# Patient Record
Sex: Female | Born: 1980 | Race: Black or African American | Hispanic: No | Marital: Single | Smoking: Current every day smoker
Health system: Southern US, Community
[De-identification: ages and names within clinical notes are randomized; demographics above are authoritative.]

## PROBLEM LIST (undated history)

## (undated) DIAGNOSIS — L309 Dermatitis, unspecified: Secondary | ICD-10-CM

---
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
## 2000-01-04 ENCOUNTER — Inpatient Hospital Stay (HOSPITAL_COMMUNITY): Admission: AD | Admit: 2000-01-04 | Discharge: 2000-01-04 | Payer: Self-pay | Admitting: Obstetrics & Gynecology

## 2000-08-27 ENCOUNTER — Emergency Department (HOSPITAL_COMMUNITY): Admission: EM | Admit: 2000-08-27 | Discharge: 2000-08-27 | Payer: Self-pay | Admitting: Emergency Medicine

## 2001-01-14 ENCOUNTER — Emergency Department (HOSPITAL_COMMUNITY): Admission: EM | Admit: 2001-01-14 | Discharge: 2001-01-14 | Payer: Self-pay | Admitting: *Deleted

## 2001-03-12 ENCOUNTER — Emergency Department (HOSPITAL_COMMUNITY): Admission: EM | Admit: 2001-03-12 | Discharge: 2001-03-13 | Payer: Self-pay | Admitting: Emergency Medicine

## 2001-07-23 ENCOUNTER — Emergency Department (HOSPITAL_COMMUNITY): Admission: EM | Admit: 2001-07-23 | Discharge: 2001-07-24 | Payer: Self-pay

## 2001-11-04 ENCOUNTER — Emergency Department (HOSPITAL_COMMUNITY): Admission: EM | Admit: 2001-11-04 | Discharge: 2001-11-05 | Payer: Self-pay

## 2002-01-12 ENCOUNTER — Inpatient Hospital Stay (HOSPITAL_COMMUNITY): Admission: AD | Admit: 2002-01-12 | Discharge: 2002-01-12 | Payer: Self-pay | Admitting: Obstetrics and Gynecology

## 2002-02-21 ENCOUNTER — Other Ambulatory Visit: Admission: RE | Admit: 2002-02-21 | Discharge: 2002-02-21 | Payer: Self-pay | Admitting: *Deleted

## 2002-03-04 ENCOUNTER — Inpatient Hospital Stay (HOSPITAL_COMMUNITY): Admission: AD | Admit: 2002-03-04 | Discharge: 2002-03-04 | Payer: Self-pay | Admitting: *Deleted

## 2002-04-30 ENCOUNTER — Inpatient Hospital Stay (HOSPITAL_COMMUNITY): Admission: AD | Admit: 2002-04-30 | Discharge: 2002-04-30 | Payer: Self-pay | Admitting: *Deleted

## 2002-05-01 ENCOUNTER — Encounter: Payer: Self-pay | Admitting: *Deleted

## 2002-05-04 ENCOUNTER — Emergency Department (HOSPITAL_COMMUNITY): Admission: EM | Admit: 2002-05-04 | Discharge: 2002-05-04 | Payer: Self-pay | Admitting: Emergency Medicine

## 2002-06-25 ENCOUNTER — Inpatient Hospital Stay (HOSPITAL_COMMUNITY): Admission: AD | Admit: 2002-06-25 | Discharge: 2002-06-27 | Payer: Self-pay | Admitting: Obstetrics and Gynecology

## 2002-06-25 ENCOUNTER — Encounter (INDEPENDENT_AMBULATORY_CARE_PROVIDER_SITE_OTHER): Payer: Self-pay | Admitting: Specialist

## 2002-07-02 ENCOUNTER — Inpatient Hospital Stay (HOSPITAL_COMMUNITY): Admission: AD | Admit: 2002-07-02 | Discharge: 2002-07-02 | Payer: Self-pay | Admitting: Obstetrics and Gynecology

## 2002-11-12 ENCOUNTER — Encounter: Admission: RE | Admit: 2002-11-12 | Discharge: 2002-11-12 | Payer: Self-pay | Admitting: Sports Medicine

## 2003-08-25 ENCOUNTER — Emergency Department (HOSPITAL_COMMUNITY): Admission: EM | Admit: 2003-08-25 | Discharge: 2003-08-25 | Payer: Self-pay | Admitting: Emergency Medicine

## 2004-01-22 ENCOUNTER — Ambulatory Visit: Payer: Self-pay | Admitting: Family Medicine

## 2004-04-26 ENCOUNTER — Emergency Department (HOSPITAL_COMMUNITY): Admission: EM | Admit: 2004-04-26 | Discharge: 2004-04-26 | Payer: Self-pay | Admitting: Emergency Medicine

## 2004-08-10 ENCOUNTER — Other Ambulatory Visit: Admission: RE | Admit: 2004-08-10 | Discharge: 2004-08-10 | Payer: Self-pay | Admitting: Family Medicine

## 2004-08-10 ENCOUNTER — Ambulatory Visit: Payer: Self-pay | Admitting: Sports Medicine

## 2005-01-24 ENCOUNTER — Emergency Department (HOSPITAL_COMMUNITY): Admission: EM | Admit: 2005-01-24 | Discharge: 2005-01-24 | Payer: Self-pay | Admitting: Emergency Medicine

## 2005-05-30 ENCOUNTER — Ambulatory Visit: Payer: Self-pay | Admitting: Family Medicine

## 2005-05-30 ENCOUNTER — Encounter: Payer: Self-pay | Admitting: Emergency Medicine

## 2005-05-30 ENCOUNTER — Observation Stay (HOSPITAL_COMMUNITY): Admission: AD | Admit: 2005-05-30 | Discharge: 2005-05-31 | Payer: Self-pay | Admitting: Gynecology

## 2005-07-28 ENCOUNTER — Ambulatory Visit: Payer: Self-pay | Admitting: Family Medicine

## 2005-08-03 ENCOUNTER — Encounter (INDEPENDENT_AMBULATORY_CARE_PROVIDER_SITE_OTHER): Payer: Self-pay | Admitting: *Deleted

## 2005-08-06 ENCOUNTER — Ambulatory Visit: Payer: Self-pay | Admitting: Family Medicine

## 2005-08-06 ENCOUNTER — Encounter (INDEPENDENT_AMBULATORY_CARE_PROVIDER_SITE_OTHER): Payer: Self-pay | Admitting: *Deleted

## 2005-08-17 ENCOUNTER — Ambulatory Visit: Payer: Self-pay | Admitting: Sports Medicine

## 2005-08-19 ENCOUNTER — Ambulatory Visit: Payer: Self-pay | Admitting: Family Medicine

## 2005-08-27 ENCOUNTER — Ambulatory Visit (HOSPITAL_COMMUNITY): Admission: RE | Admit: 2005-08-27 | Discharge: 2005-08-27 | Payer: Self-pay | Admitting: Family Medicine

## 2005-09-14 ENCOUNTER — Ambulatory Visit: Payer: Self-pay | Admitting: Family Medicine

## 2005-10-15 ENCOUNTER — Ambulatory Visit: Payer: Self-pay | Admitting: Family Medicine

## 2005-11-12 ENCOUNTER — Ambulatory Visit: Payer: Self-pay | Admitting: Family Medicine

## 2005-12-13 ENCOUNTER — Ambulatory Visit: Payer: Self-pay | Admitting: Family Medicine

## 2006-01-10 ENCOUNTER — Encounter (INDEPENDENT_AMBULATORY_CARE_PROVIDER_SITE_OTHER): Payer: Self-pay | Admitting: Specialist

## 2006-01-10 ENCOUNTER — Inpatient Hospital Stay (HOSPITAL_COMMUNITY): Admission: AD | Admit: 2006-01-10 | Discharge: 2006-01-13 | Payer: Self-pay | Admitting: Gynecology

## 2006-01-10 ENCOUNTER — Ambulatory Visit: Payer: Self-pay | Admitting: Gynecology

## 2006-01-18 ENCOUNTER — Ambulatory Visit: Payer: Self-pay | Admitting: Obstetrics and Gynecology

## 2006-06-02 DIAGNOSIS — F339 Major depressive disorder, recurrent, unspecified: Secondary | ICD-10-CM | POA: Insufficient documentation

## 2006-06-02 DIAGNOSIS — L2089 Other atopic dermatitis: Secondary | ICD-10-CM

## 2006-06-02 DIAGNOSIS — G44209 Tension-type headache, unspecified, not intractable: Secondary | ICD-10-CM

## 2006-06-02 DIAGNOSIS — J309 Allergic rhinitis, unspecified: Secondary | ICD-10-CM | POA: Insufficient documentation

## 2006-06-03 ENCOUNTER — Encounter (INDEPENDENT_AMBULATORY_CARE_PROVIDER_SITE_OTHER): Payer: Self-pay | Admitting: *Deleted

## 2006-09-26 ENCOUNTER — Encounter (INDEPENDENT_AMBULATORY_CARE_PROVIDER_SITE_OTHER): Payer: Self-pay | Admitting: Family Medicine

## 2007-02-04 ENCOUNTER — Emergency Department (HOSPITAL_COMMUNITY): Admission: EM | Admit: 2007-02-04 | Discharge: 2007-02-04 | Payer: Self-pay | Admitting: Emergency Medicine

## 2008-02-19 ENCOUNTER — Emergency Department (HOSPITAL_COMMUNITY): Admission: EM | Admit: 2008-02-19 | Discharge: 2008-02-19 | Payer: Self-pay | Admitting: Emergency Medicine

## 2009-10-05 IMAGING — CR DG CHEST 2V
2 series · 2 of 2 positions shown · non-contrast
Comparison: None

CLINICAL DATA: Chest pain

CHEST - 2 VIEW

[w chest pa]
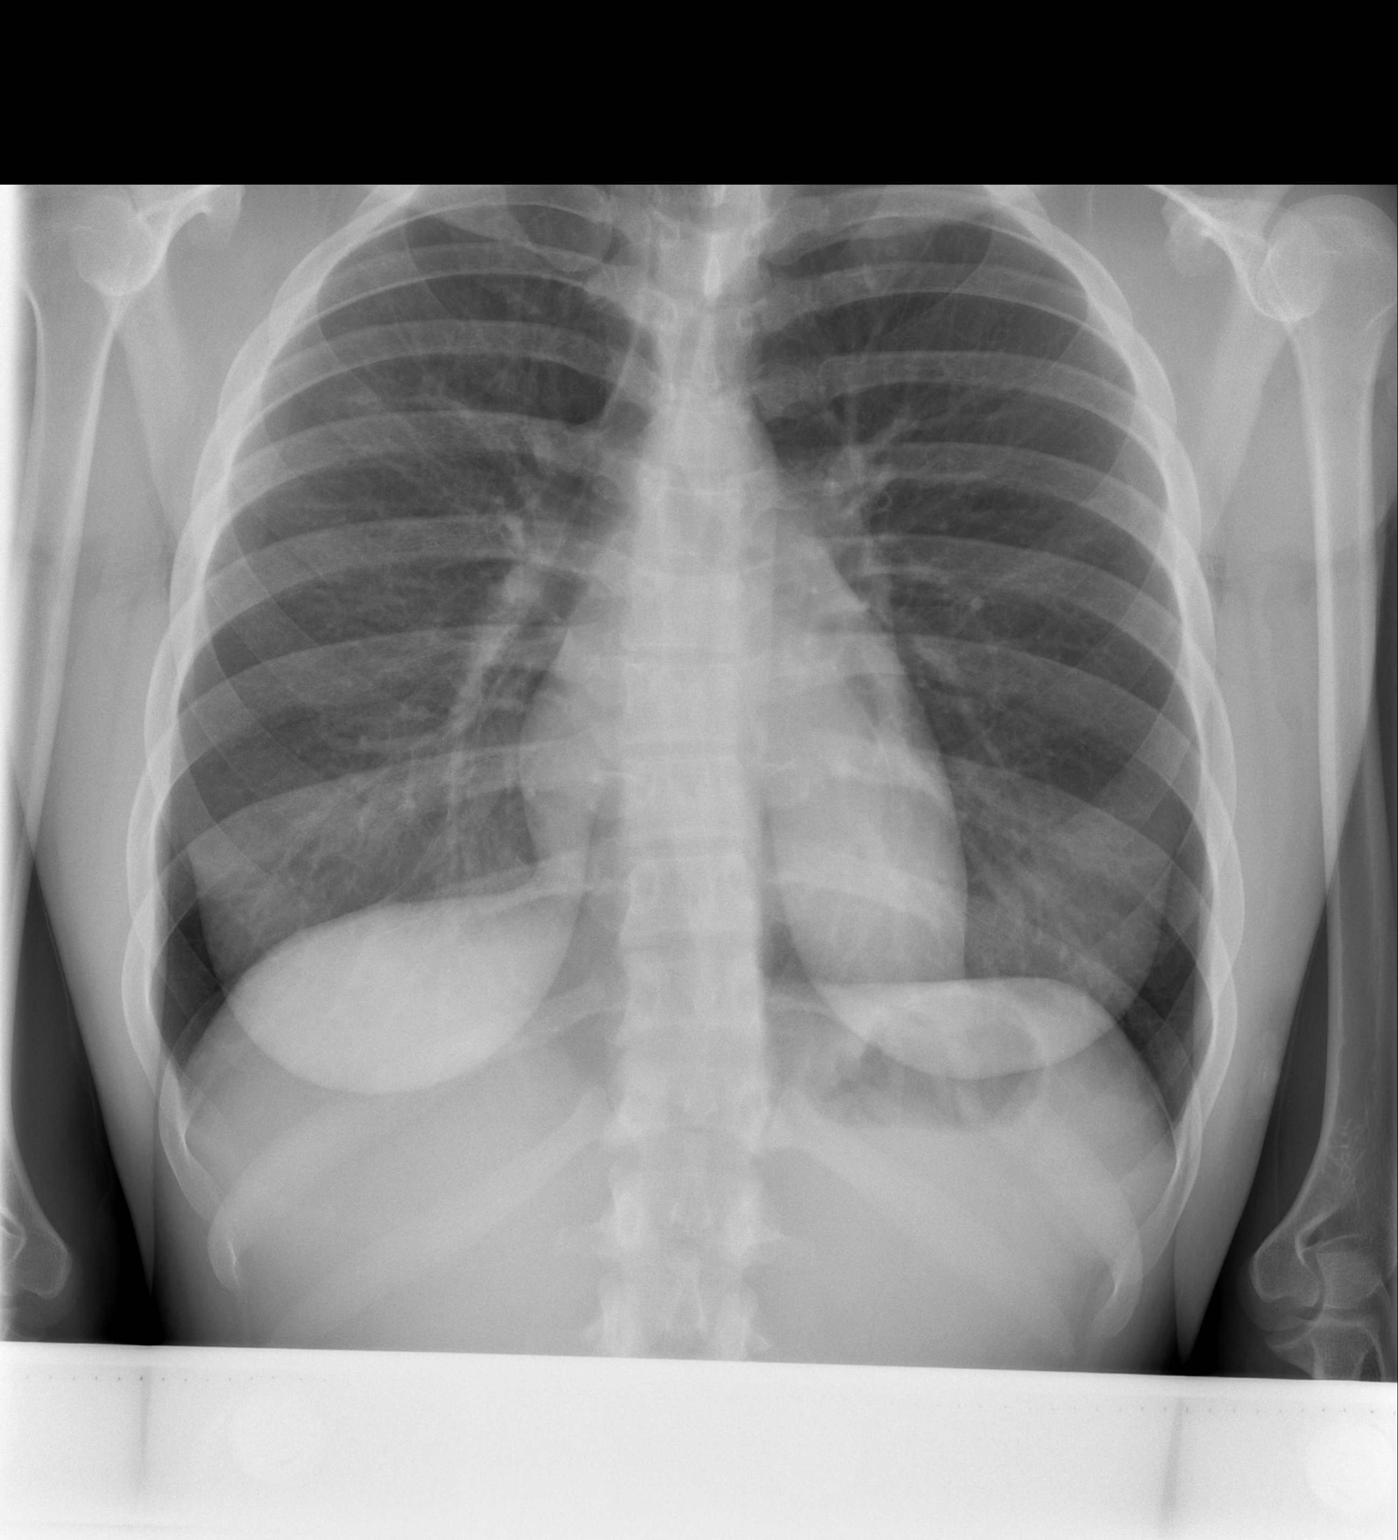

[w chest lat]
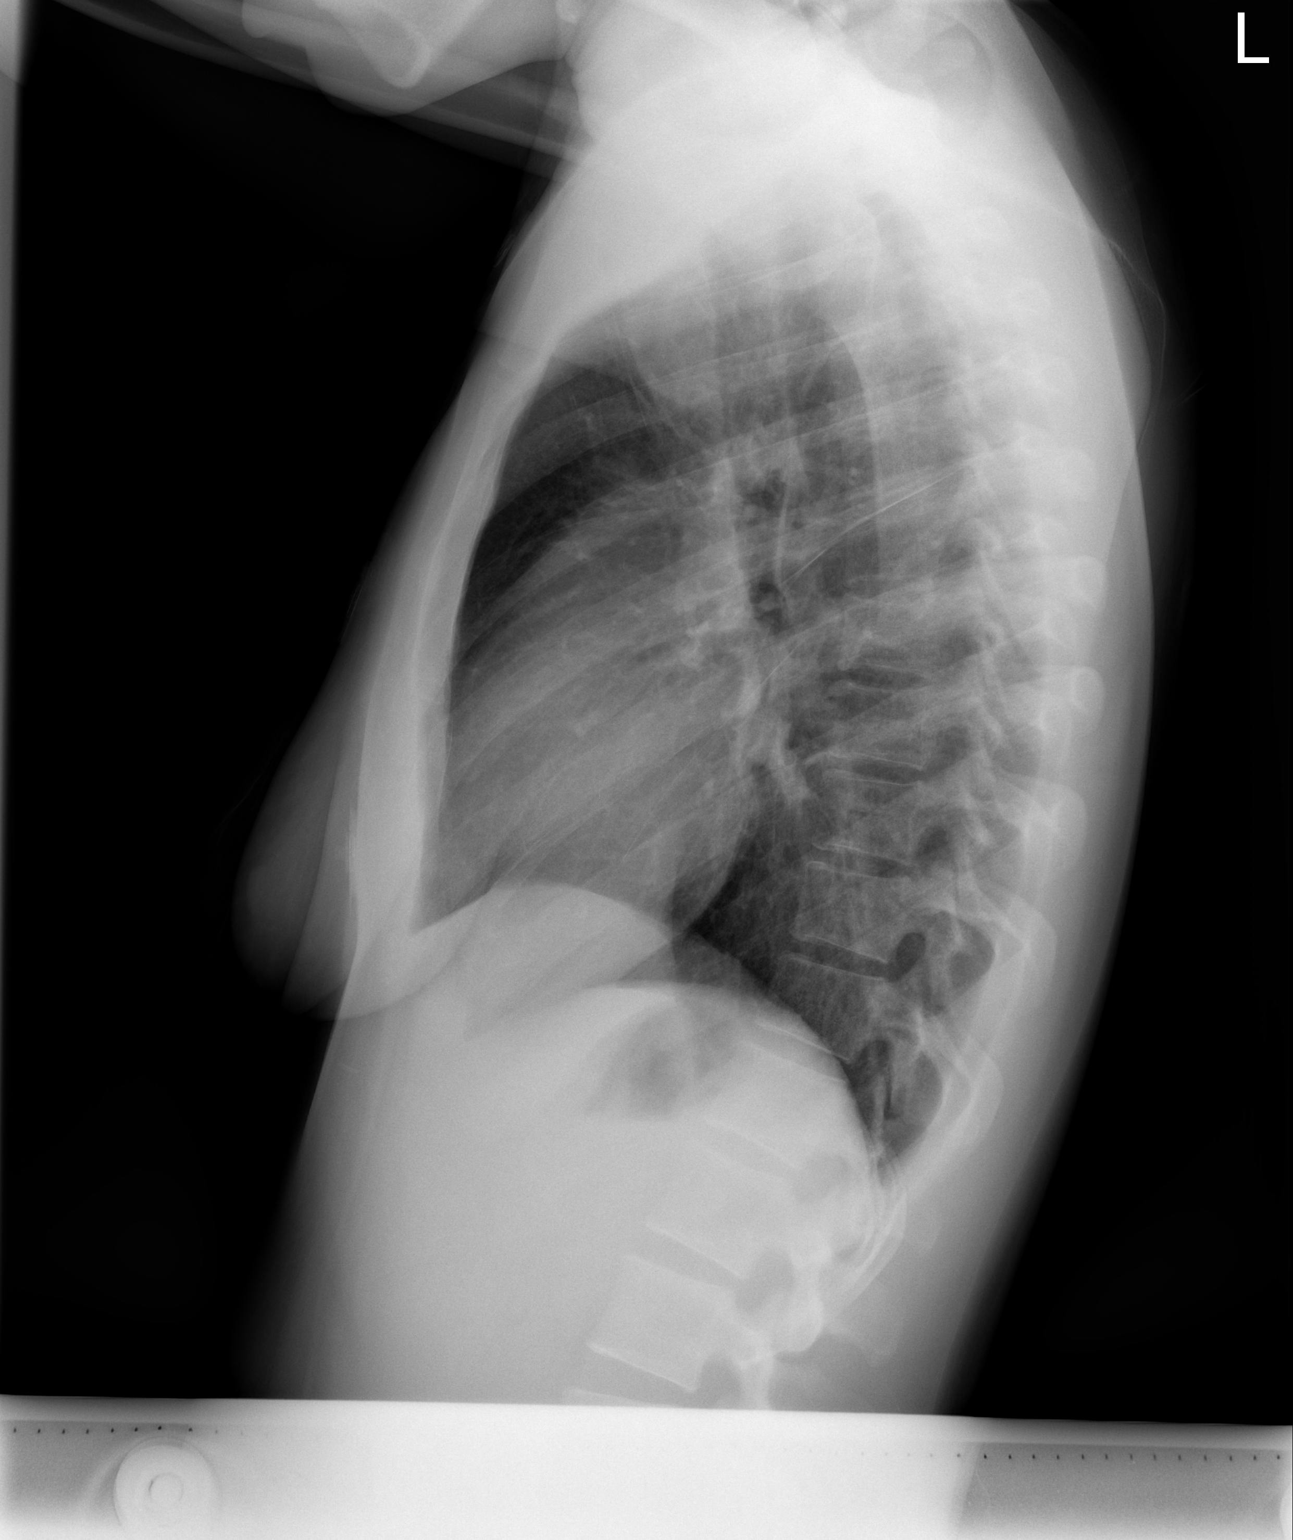

[2 of 2 positions shown; findings below may reference images not displayed]

FINDINGS: Heart size is normal.  Lungs are clear.  No pleural
effusion.
IMPRESSION: No acute cardiopulmonary process.

## 2010-08-21 NOTE — Discharge Summary (Signed)
[name], FREW               ACCOUNT NO.:  [id number]   MEDICAL RECORD NO.:  [id number]          PATIENT TYPE:  INP   LOCATION:  9302                          FACILITY:  [facility]   PHYSICIAN:  Angie B. Merlene Morse, MD  DATE OF BIRTH:  1981/03/21   DATE OF ADMISSION:  05/30/2005  DATE OF DISCHARGE:  05/31/2005                                 DISCHARGE SUMMARY   ADMITTING ATTENDING:  Dr. Farrel Gobble.   DISCHARGING ATTENDING:  Dr. Penne Lash.   ADMITTING HISTORY AND PHYSICAL:  The patient is a 30 year old G5, P3-0-1-3,  who was transferred to Dr. Farrel Gobble from [facility] with a 1-1/2  week history of nausea and vomiting.  She was to be [redacted] weeks pregnant by an  ultrasound.  She states that she had no history of hyperemesis with her  other 3 children.   HOSPITAL COURSE:  The patient was admitted.  She received IV fluid  hydration.  She received Reglan, Zofran and Phenergan.  The Reglan was  scheduled, and Zofran and Phenergan were as needed.  She was tolerating food  at the time of discharge and had not vomited in greater than 24 hours so she  was discharged home.   SIGNIFICANT LABORATORY:  BMP:  Sodium 135, potassium 3.2, chloride 104, CO2  25, glucose 102, BUN less than 1, creatinine 0.7 and calcium 8.5.  She had a  UA that was negative except for ketones greater than 80.  Quantitative beta  hCG of 23,166.  GC and Chlamydia negative.  Wet prep negative.  Amylase and  lipase within normal limits.  AST 14 and ALT 10.  CBC with a white count of  11.6, hemoglobin 13.1, hematocrit 38.2 and platelets 258.  Ultrasound on  February 25 showed an intrauterine pregnancy with fetal heart tones of 182.  Crown rump length 3.9 mm corresponding gestational age of [redacted] weeks 0 days.   DISCHARGE INSTRUCTIONS:  The patient was discharged to home.  She was to  follow up with [facility].   DISCHARGE MEDICINES:  1.  Phenergan 20 mg 1 p.o. q.4-6h. p.r.n. #60 with 1 refill.  2.  Reglan 10 mg 1 p.o.  q.i.d. #120 with 1 refill.  3.  Zofran 8 mg sublingual q.8h. p.r.n. #60 with 1 refill.  4.  Phenergan suppository 25 mg #20 with 1 refill.  5.  Prenatal vitamins.           ______________________________  August Saucer Merlene Morse, MD     ABC/MEDQ  D:  05/31/2005  T:  05/31/2005  Job:  [id number]

## 2010-08-21 NOTE — Discharge Summary (Signed)
[facility]  Patient:    [name], [name]                      MRN: [id number] Adm. Date:  [id number] Disc. Date: [id number] Attending:  Tammi Sou Dictator:   Raynelle Jan, M.D. CC:         Bing Neighbors. Clearance Coots, M.D.             [facility]                           Discharge Summary  DATE OF BIRTH:                1980-11-02.  ADMISSION DIAGNOSES:          1. Intrauterine pregnancy.                               2. Febrile illness.  DISCHARGE DIAGNOSES:          1. Viral syndrome.                               2. Intrauterine pregnancy.  ADMISSION HISTORY:            Briefly, this is an 30 year old gravida 3, para 1-0-1-1 at approximately 5-weeks and 6-days gestation via ultrasound in Maternity Admission, who presents with a two-day history of fever, chills, muscle aches, headache, nausea and vomiting; complained of chest pain, which was sharp in nature, lasting approximately two minutes and low lower abdominal pain.  Her prenatal care is at [facility].  PAST MEDICAL HISTORY:         Significant only for Trichomonas.  MEDICATIONS:                  She is only on prenatal vitamins and Tylenol.  ALLERGIES:                    She has no known drug allergies.  SOCIAL HISTORY:               She is unemployed.  No tobacco, alcohol or drug use.  FAMILY HISTORY:               Negative.  PHYSICAL EXAMINATION:         Her exam on admission included a temperature of 101.8, her pulse was 122, her respirations 20, her blood pressure was 137/59. he was saturating 97% on room air.  She appeared comfortable, in no apparent distress. HEENT was unremarkable.  Her neck was supple.  LUNGS:  She had decreased breath  sounds in the right lower lobe.  Heart was tachycardic but was otherwise unremarkable.  Her abdomen was soft and was tender to palpation in the bilateral lower quadrants and some tenderness in the  suprapubic area.  She had positive bowel sounds.  No rebound or guarding.  Her genital exam was significant for a whitish discharge with some slight bilateral adnexal tenderness.  Her extremities are without clubbing, cyanosis, or edema.  Her skin has increased warmth but no rash.  LABORATORY DATA:              On admission, her CBC showed a white count of 4.5, hemoglobin of 11.4 and hematocrit 32.6 and platelets were 235,000 with  a normal  differential.  Her urine was positive for ketones but was negative for leukocyte esterase or nitrite.  Her wet prep had a few yeasts, moderate wbcs, too numerous to count bacteria.  BMET was within normal limits.  Her serum beta hCG was 12,535.  An ultrasound was performed on the night of admission, showing an intrauterine gestational sac consistent with 5-weeks 6-days gestation and she was admitted to the third floor due to fever, chills, dehydration, nausea and vomiting.  HOSPITAL COURSE:              Over her hospital course, patient gradually improved with hydration, Phenergan and Motrin for her aches and pains.  By discharge, she had remained afebrile x 48 hours.  Her exam was significant only for some mild bilateral lower abdominal tenderness and some mild substernal and epigastric tenderness which were relieved with Motrin and GI cocktail.  By discharge, it was felt that her symptoms were most likely secondary to a viral illness and that her dehydration had resolved.  She was able to eat and drink normally, she was ambulating and her symptoms had improved.  She was discharged on May 15, 1999 with followup at [facility].  DISCHARGE MEDICATIONS:        1. Ibuprofen 600 mg p.o. q.6h. p.r.n. pain.                               2. Prenatal vitamins p.o. q.d.  DIET:                         No restrictions.  ACTIVITY:                     No restrictions.  FOLLOWUP:                     She will follow up over at [facility]  on May 29, 1999, as the appointment had already been set up by the patient. DD:  05/15/99 TD:  05/18/99 Job: [id number]

## 2010-08-21 NOTE — Op Note (Signed)
[name], [name]               ACCOUNT NO.:  [id number]   MEDICAL RECORD NO.:  [id number]          PATIENT TYPE:  INP   LOCATION:  9127                          [facility]  [facility]   PHYSICIAN:  Ginger Carne, MD  DATE OF BIRTH:  Apr 11, 1980   DATE OF PROCEDURE:  01/10/2006  DATE OF DISCHARGE:                                 OPERATIVE REPORT   PREOPERATIVE DIAGNOSIS:  Repeat cesarean section, active labor with  sterilization request.   POSTOPERATIVE DIAGNOSES:  1. Repeat cesarean section, active labor with sterilization request.  2. Term viable delivery of female infant.   PROCEDURE:  Repeat low transverse cesarean section, Pomeroy bilateral tubal  ligation.   SURGEON:  Ginger Carne, M.D.   ASSISTANT:  None.   COMPLICATIONS:  None immediate.   ESTIMATED BLOOD LOSS:  650 mL.   SPECIMEN:  Right and left portions of tubes sent to pathology.   ANESTHESIA:  Spinal.   OPERATIVE FINDINGS:  Term infant female delivered in vertex presentation.  Apgars and weight per delivery room record.  No gross abnormalities.  The  baby cried spontaneously at delivery.  Amniotic fluid clear.  Uterus, tubes  and ovaries showed normal decidual changes of pregnancy.  Both tubes  identified from their isthmus to fimbriated end, separate and apart from  their respective round ligaments.   OPERATIVE PROCEDURE:  The patient prepped and draped in usual fashion and  placed in left lateral supine position.  Betadine solution used for  antiseptic, and the patient was catheterized prior to procedure.   After adequate spinal analgesia, an infraumbilical vertical incision was  made and the abdomen opened.  The lower uterine segment incised transversely  away from the bladder edge.  The baby delivered.  Cord clamped and cut, and  infant given to the pediatric staff after bulb suctioning.  Placenta removed  manually.  Uterus inspected.  Closure of the uterine musculature in one  layer with 3-0 Vicryl  running interlocking suture from either end to  midline.  Bleeding points hemostatically checked.  Blood clots removed.   Standard Pomeroy bilateral tubal ligation performed by ligating the isthmus  ampullary portion of the tubes, incorporating about 3 cm with 2-0 plain  suture twice.  The tubes cut above said knots, tips cauterized.  No active  bleeding noted.  The uterus returned to the abdomen.  Closure of the fascia  in two separate layers from either end to midline with 0 Vicryl running  suture and skin staples for the skin.  Instrument and sponge counts were  correct.   The patient tolerated procedure well and returned to the postanesthesia  recovery room in excellent condition.   ADDENDUM:  Placenta was complete, three-vessel cord, central insertion.      Ginger Carne, MD  Electronically Signed     SHB/MEDQ  D:  01/10/2006  T:  01/11/2006  Job:  [id number]

## 2010-08-21 NOTE — Discharge Summary (Signed)
[name], [name]               ACCOUNT NO.:  [id number]   MEDICAL RECORD NO.:  [id number]          PATIENT TYPE:  INP   LOCATION:  9127                          [facility]  [facility]   PHYSICIAN:  Kristin Carne, MD  DATE OF BIRTH:  [birth year]   DATE OF ADMISSION:  01/10/2006  DATE OF DISCHARGE:  01/13/2006                                 DISCHARGE SUMMARY   ADMISSION DIAGNOSIS:  30 year old G5, P3-0-1-3, at 54 weeks with a history  of prior cesarean section x3 who was admitted in early labor.   HOSPITAL COURSE:  The patient was admitted to have her C-section.  She also  desired a BTL and her paper was signed prior to coming here.  The patient  underwent a cesarean section on January 10, 2006.  Please see operative note  for more details.  The patient had an infraumbilical vertical incision and  then an LTCS.  A viable female infant was delivered with three vessel cord.  The baby's Apgars were 9 and 9.  The patient also had a BTL after the  delivery.  The patient had a normal postpartum course and is being  discharged home on postop day three.  She is hemodynamically stable and is  ready to go home.  The patient was O positive, antibody negative, Rubella  immune.  She did not require MR or RhoGAM.  Her baby was circumcised during  her stay here. She is bottle feeding.   LABORATORY DATA:  Prenatal labs include patient O positive, antibody screen  negative, hemoglobin 11.6, platelets 278, Rubella immune, hepatitis B  surface antigen negative, HIV declined, Gonorrhea and Chlamydia negative,  GBS positive on December 21, 2005, DCT 58.  Labs here at the hospital  revealed CBC on October 8 showed white blood cell count 9.6, hemoglobin  12.8, hematocrit 37.8, platelets 189.  On October 9, repeat CBC showed white  blood cell count 13.5, hemoglobin 10.4, hematocrit 30, platelets 144.  She  was O positive, antibody negative, this was nonreactive.   PROCEDURES:  October 8, BTL as well as repeat  LTCS with a vertical incision.   DISCHARGE STATUS:  Stable and improved.   DISCHARGE DIAGNOSIS:  The patient is a 30 year old G5, P4-0-1-4, postop day  three after a repeat low transverse cesarean section with vertical incision  and bilateral tubal ligation.   DISCHARGE MEDICATIONS:  She was given prescriptions for Percocet, ibuprofen,  Colace.  Her BTL serves as her contraception.   DISCHARGE INSTRUCTIONS:  She is to follow up in six weeks at [facility]  and will call to schedule the appointment.  She will also return to the  clinic downstairs postop day 7 through 10 to get her staples removed.     ______________________________  [name], M.D.      Kristin Carne, MD  Electronically Signed    JH/MEDQ  D:  01/13/2006  T:  01/14/2006  Job:  [id number]

## 2010-08-21 NOTE — Discharge Summary (Signed)
NAME:  [name], [name]                         ACCOUNT NO.:  [id number]   MEDICAL RECORD NO.:  [id number]                   PATIENT TYPE:  INP   LOCATION:  9104                                 [facility]  [facility]   PHYSICIAN:  Phil D. Okey Dupre, M.D.                  DATE OF BIRTH:  12-Sep-1980   DATE OF ADMISSION:  06/25/2002  DATE OF DISCHARGE:  06/27/2002                                 DISCHARGE SUMMARY   DISCHARGE DIAGNOSES:  1. Repeat cesarean section.  2. Delivery of single newborn.   DISCHARGE MEDICATIONS:  1. Motrin 800 mg q.8h. p.o. p.r.n.  2. Birth control patch one patch per week for three weeks, no patch on the     fourth week.  The patient is to return to me on Monday, July 02, 2002     for incision check.   ACTIVITY:  Per booklet.  Sexual activity includes no sexual intercourse for  six weeks.  Strict pelvic rest x6 weeks.   DIET:  Regular.   FOLLOW UP:  The patient is to follow up at [facility] in six weeks for  routine postpartum check.   DISPOSITION:  The patient is discharged to home in stable and improved  condition.   HISTORY OF PRESENT ILLNESS:  This was a 30 year old G4, P2-0-1-2 who  presented for a repeat elective cesarean section.  Dated by last menstrual  period she presented estimated date of delivery July 03, 2002.  The patient  had very unremarkable prenatal course.  Was O+.  Antibody negative.  Nonreactive RPR.  Rubella immune.  Hepatitis B surface antigen negative.  The patient had benign examination upon arrival.  The patient was taken to  the operating room for repeat cesarean section at which point in time she  delivered a baby female with spontaneous respirations, three vessel cord at  11:04 on June 25, 2002.  Baby's Apgars were 8 and 9.  Baby's weight was 6  pounds 12 ounces.  Length was 19-1/4 inches.  Head was 13-1/4 inches.  Estimated blood loss was [phone] mL.  This was a scheduled repeat cesarean  section.  The patient's  postoperative course was complicated by wound  difficulties with the patient's incision had poor approximation and some  blood drain on postoperative day one.  Staples had pulled through to the  incision were located unilaterally.  Steri-Strips were applied to the center  of the incision and pressure dressing was applied at that point in time.  The patient had good pain control with oral medications.  Incision remained  clean, dry, and intact.  The patient remained afebrile throughout postpartum  course, was ambulating well, tolerating diet, and had no urinary complaints.  On day of discharge patient had a white count of 9.1, hemoglobin of 9.2.  Did not have any complaints.  Desired the patch for contraception.  Was  bottle-feeding her baby girl.  The patient was discharged with the final  medications as noted above and was discharged home in stable, improved  condition and will follow up for incision check on Monday, March 29 and will  follow up for routine postpartum check at [facility] in six weeks.     Alvira Philips, M.D.                      Phil D. Okey Dupre, M.D.    RM/MEDQ  D:  07/24/2002  T:  07/25/2002  Job:  [id number]

## 2010-08-21 NOTE — Op Note (Signed)
NAME:  [name], [name]                         ACCOUNT NO.:  [id number]   MEDICAL RECORD NO.:  [id number]                   PATIENT TYPE:  INP   LOCATION:  9198                                 [facility]   PHYSICIAN:  Phil D. Okey Dupre, M.D.                  DATE OF BIRTH:  02/06/1981   DATE OF PROCEDURE:  06/25/2002  DATE OF DISCHARGE:                                 OPERATIVE REPORT   PROCEDURE:  Low transverse cesarean section.   PREOPERATIVE DIAGNOSES:  Repeat cesarean section.   POSTOPERATIVE DIAGNOSES:  1. Repeat cesarean section.  2. Inadvertent extraperitoneal cesarean section.   PROCEDURE:  Under satisfactory spinal anesthesia with patient in dorsal  supine position the abdomen was prepped and draped in the usual sterile  manner and entered through a transverse incision through a previous cesarean  section scar.  The abdomen was entered by layers down to the fascia.  The  fascia was then opened transversely and the fascia was then undermined  caudally and dissected away from the underlying preperitoneal fat.  On going  through the preperitoneal fat by blunt dissection we came upon the uterus,  the lower segment of which was opened and membranes were seen through this.  The incision into the uterine cavity was then extended by blunt dissection  and from an ROP presentation the baby was delivered.  It was a female infant  with an 8/9 Apgar.  At this point examining our incision it was obvious that  we were completely extraperitoneal and the uterus was closed with continuous  running locked 0 Vicryl on an atraumatic needle after the placenta had been  delivered and the uterus explored and the cervix dilated with a ring  forceps.  Small bleeders were then controlled with hot cautery and the  fascia closed with continuous alternating locked 0 Vicryl on an atraumatic  needle.  Subcutaneous bleeders were controlled with hot cautery and the skin  edges were approximated with  skin staples.  A dry, sterile dressing was  applied.  The Foley catheter was noted to drain clear amber urine and there  was a total blood loss of 800 mL.  The patient tolerated the procedure well.  Was transferred to recovery room in satisfactory condition.  Tape,  instrument, sponge, and needle count reported as correct at the end of the  procedure.  Note, I would suggest that the next surgeon go in with a midline  incision as it would be very easy to enter the bladder and I do not know how  much scarring is in the peritoneal cavity since we never entered the  peritoneal cavity.  Phil D. Okey Dupre, M.D.    PDR/MEDQ  D:  06/25/2002  T:  06/25/2002  Job:  [id number]

## 2010-08-21 NOTE — Discharge Summary (Signed)
[facility]  Patient:    [name], [name]                      MRN: [id number] Adm. Date:  [id number] Disc. Date: [id number] Attending:  Antionette Char Dictator:   Ebbie Ridge, M.D.                           Discharge Summary  DISCHARGE DIAGNOSES:          1. Delivery of single live born.                               2. Low cervical cesarean section.                               3. Lysis of peritoneal adhesions.                               4. Amnioinfusion.                               5. Genitourinary infection.  DISCHARGE MEDICATIONS:        1. Ibuprofen 600 mg q.4-6h. p.r.n. pain.                               2. Prenatal vitamins.                               3. Micronor.                               4. Iron sulfate.  HISTORY OF PRESENT ILLNESS:   This 30 year old G3, P1-0-1-1, presented to [facility] at 39 weeks with contractions every 2-5 minutes.  She was admitted to labor and delivery, given Stadol and Phenergan for pain and penicillin for a group B strep positive status.  HOSPITAL COURSE:              Shortly after admission, the patient declined a trial of labor and stated that because she had had a previous cesarean delivery, she wanted to deliver in this manner again.  At 12:37 on December 31, 1999, she delivered a viable female via low transverse cesarean section. Apgars were 8 and one minute and 9 at five minutes.  Amniotic fluid was meconium-stained.  All layers were closed in the usual fashion.  There were no complications.  The patient recovered well.  She was discharged on postop day two with ibuprofen for pain, prenatal vitamins, iron for a low hemoglobin of 9.6, and Micronor for oral contraception.  FOLLOW-UP:                    She will follow up at [facility] in six weeks. DD:  01/25/00 TD:  01/25/00 Job: [id number]

## 2010-08-21 NOTE — Op Note (Signed)
[facility]  Patient:    [name], [name] Visit Number: [id number] MRN: [id number]          Service Type: EMS Location: ED Attending Physician:  Sandi Raveling Proc. Date: 12/28/96 Admit Date:  03/12/2001 Discharge Date: 03/13/2001                             Operative Report  PREOPERATIVE DIAGNOSES:       Forty-week intrauterine pregnancy, arrest of dilatation, gestational hypertension.  POSTOPERATIVE DIAGNOSES:      Forty-week intrauterine pregnancy, arrest of dilatation, gestational hypertension.  PROCEDURE:                    Primary low transverse cesarean section via Pfannenstiel.  SURGEONS:                     Roseanna Rainbow, M.D.                               Fernande Boyden, M.D.  ANESTHESIA:                   Epidural.  COMPLICATIONS:                None.  ESTIMATED BLOOD LOSS:         800 cc.  FLUIDS:                       2100 cc lactated Ringers.  URINE OUTPUT:                 250 cc clear urine.  INDICATIONS:                  The patient is a 30 year old G1, P0 at 40-weeks who presented in prodromal labor and underwent augmentation and was also found to have gestational hypertension. Maximum dilatation was 6 cm.  FINDINGS:                     Female infant in cephalic presentation.  Pediatrics present at delivery.  Apgars 7/9 at one and five minutes, respectively.  Normal uterus, tubes and ovaries.  DESCRIPTION OF PROCEDURE:     The patient was taken to the operating room where epidural anesthesia was found to be adequate. She was prepped and draped in the normal sterile fashion in the dorsal supine position with a leftward tilt.  A Pfannenstiel skin incision was then made with a scalpel and carried through to the underlying layer of the fascia.  The fascia was then nicked in the midline and the incision extended laterally with the Mayo scissors.  The superior aspect of the fascial incision was then grasped  with Kocher clamps, elevated and the underlying rectus muscles dissected off bluntly.  Attention was then turned to the inferior aspect of this incision which, in a similar fashion, was grasped, tented up with Kocher clamps and the rectus muscles dissected off bluntly.  The rectus muscles were then separated in the midline and the peritoneum identified, tented up and entered sharply with Metzenbaum scissors.  The peritoneal incision was then extended superiorly and inferiorly with good visualization of the bladder.  The bladder was inserted and the vesicouterine peritoneum identified, grasped with the pickup clamps and entered sharply with the Metzenbaum scissors.  This incision was extended laterally  and the bladder flap created digitally.  The bladder blade was then reinserted and the lower uterine segment incised in a transverse fashion with a scalpel.  The uterine incision was then extended laterally with the bandage scissors.  The bladder blade was removed and the infants head delivered atraumatically. The nose and mouth were suctioned with a bulb suction and the cord clamped and cut.  The infant was handed off to the awaiting pediatricians.  The placenta was removed and the uterus was cleared of all clots and debris.  The uterine incision was repaired with 0 Monocryl in a running-lock fashion.  Excellent hemostasis was noted.  The gutters were cleared of all clots.  The fascia was reapproximated with 0 Vicryl in a running fashion.  The skin was closed with staples.  The patient tolerated the procedure well.  Sponge, lap and needle counts were correct x 2.  Two grams of cefazolin were given at cord clamp.  The patient was taken to the recovery room in stable condition. Attending Physician:  Sandi Raveling DD:  01/21/97 TD:  01/21/97 Job: [id number]

## 2010-09-29 ENCOUNTER — Emergency Department (HOSPITAL_COMMUNITY)
Admission: EM | Admit: 2010-09-29 | Discharge: 2010-09-29 | Disposition: A | Discharge status: Home / Self Care | Payer: Self-pay | Attending: Emergency Medicine | Admitting: Emergency Medicine

## 2010-09-29 DIAGNOSIS — R070 Pain in throat: Secondary | ICD-10-CM | POA: Insufficient documentation

## 2010-09-29 DIAGNOSIS — R509 Fever, unspecified: Secondary | ICD-10-CM | POA: Insufficient documentation

## 2010-09-29 LAB — URINE MICROSCOPIC-ADD ON

## 2010-09-29 LAB — CBC
HCT: 36.9 % (ref 36.0–46.0)
Hemoglobin: 13 g/dL (ref 12.0–15.0)
MCH: 32.7 pg (ref 26.0–34.0)
MCHC: 35.2 g/dL (ref 30.0–36.0)
Platelets: 208 10*3/uL (ref 150–400)
RBC: 3.98 MIL/uL (ref 3.87–5.11)
RDW: 13 % (ref 11.5–15.5)

## 2010-09-29 LAB — URINALYSIS, ROUTINE W REFLEX MICROSCOPIC
Glucose, UA: NEGATIVE mg/dL
Ketones, ur: 40 mg/dL — AB
Leukocytes, UA: NEGATIVE
Nitrite: NEGATIVE
Protein, ur: NEGATIVE mg/dL
Specific Gravity, Urine: 1.026 (ref 1.005–1.030)

## 2010-09-29 LAB — DIFFERENTIAL
Basophils Absolute: 0 10*3/uL (ref 0.0–0.1)
Eosinophils Absolute: 0.1 10*3/uL (ref 0.0–0.7)
Eosinophils Relative: 1 % (ref 0–5)
Lymphs Abs: 1.2 10*3/uL (ref 0.7–4.0)
Monocytes Absolute: 1 10*3/uL (ref 0.1–1.0)

## 2010-09-29 LAB — RAPID STREP SCREEN (MED CTR MEBANE ONLY): Streptococcus, Group A Screen (Direct): NEGATIVE

## 2011-01-05 LAB — D-DIMER, QUANTITATIVE: D-Dimer, Quant: 0.3

## 2011-01-28 ENCOUNTER — Inpatient Hospital Stay (INDEPENDENT_AMBULATORY_CARE_PROVIDER_SITE_OTHER)
Admission: RE | Admit: 2011-01-28 | Discharge: 2011-01-28 | Disposition: A | Discharge status: Home / Self Care | Payer: Self-pay | Source: Ambulatory Visit | Attending: Family Medicine | Admitting: Family Medicine

## 2011-01-28 DIAGNOSIS — M779 Enthesopathy, unspecified: Secondary | ICD-10-CM

## 2011-01-28 DIAGNOSIS — S63509A Unspecified sprain of unspecified wrist, initial encounter: Secondary | ICD-10-CM

## 2013-05-31 ENCOUNTER — Encounter (HOSPITAL_COMMUNITY): Payer: Self-pay | Admitting: Emergency Medicine

## 2013-05-31 ENCOUNTER — Emergency Department (HOSPITAL_COMMUNITY)
Admission: EM | Admit: 2013-05-31 | Discharge: 2013-06-01 | Disposition: A | Discharge status: Home / Self Care | Payer: Self-pay | Attending: Emergency Medicine | Admitting: Emergency Medicine

## 2013-05-31 DIAGNOSIS — IMO0002 Reserved for concepts with insufficient information to code with codable children: Secondary | ICD-10-CM | POA: Insufficient documentation

## 2013-05-31 DIAGNOSIS — Y92410 Unspecified street and highway as the place of occurrence of the external cause: Secondary | ICD-10-CM | POA: Insufficient documentation

## 2013-05-31 DIAGNOSIS — Y9389 Activity, other specified: Secondary | ICD-10-CM | POA: Insufficient documentation

## 2013-05-31 DIAGNOSIS — S60219A Contusion of unspecified wrist, initial encounter: Secondary | ICD-10-CM | POA: Insufficient documentation

## 2013-05-31 DIAGNOSIS — S46912A Strain of unspecified muscle, fascia and tendon at shoulder and upper arm level, left arm, initial encounter: Secondary | ICD-10-CM

## 2013-05-31 DIAGNOSIS — S66912A Strain of unspecified muscle, fascia and tendon at wrist and hand level, left hand, initial encounter: Secondary | ICD-10-CM

## 2013-05-31 DIAGNOSIS — S63509A Unspecified sprain of unspecified wrist, initial encounter: Secondary | ICD-10-CM | POA: Insufficient documentation

## 2013-05-31 DIAGNOSIS — S5000XA Contusion of unspecified elbow, initial encounter: Secondary | ICD-10-CM | POA: Insufficient documentation

## 2013-05-31 DIAGNOSIS — F17200 Nicotine dependence, unspecified, uncomplicated: Secondary | ICD-10-CM | POA: Insufficient documentation

## 2013-05-31 MED ORDER — IBUPROFEN 400 MG PO TABS
800.0000 mg | ORAL_TABLET | Freq: Once | ORAL | Status: AC
  Administered 2013-06-01: 800 mg via ORAL
  Filled 2013-05-31: qty 2

## 2013-05-31 MED ORDER — CYCLOBENZAPRINE HCL 10 MG PO TABS
5.0000 mg | ORAL_TABLET | Freq: Once | ORAL | Status: AC
  Administered 2013-06-01: 5 mg via ORAL
  Filled 2013-05-31: qty 1
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
## 2013-06-01 ENCOUNTER — Emergency Department (HOSPITAL_COMMUNITY): Payer: Self-pay

## 2013-06-01 MED ORDER — CYCLOBENZAPRINE HCL 5 MG PO TABS: 5.0000 mg | ORAL_TABLET | Freq: Three times a day (TID) | ORAL | Status: DC | PRN

## 2013-06-01 MED ORDER — IBUPROFEN 800 MG PO TABS: 800.0000 mg | ORAL_TABLET | Freq: Four times a day (QID) | ORAL | Status: DC | PRN

## 2013-06-01 NOTE — ED Provider Notes (Signed)
Medical screening examination/treatment/procedure(s) were performed by non-physician practitioner and as supervising physician I was immediately available for consultation/collaboration.   Megan E Docherty, MD 06/01/13 1304 

## 2013-06-01 NOTE — Discharge Instructions (Signed)
Joint Sprain A sprain is a tear or stretch in the ligaments that hold a joint together. Severe sprains may need as long as 3-6 weeks of immobilization and/or exercises to heal completely. Sprained joints should be rested and protected. If not, they can become unstable and prone to re-injury. Proper treatment can reduce your pain, shorten the period of disability, and reduce the risk of repeated injuries. TREATMENT   Rest and elevate the injured joint to reduce pain and swelling.  Apply ice packs to the injury for 20-30 minutes every 2-3 hours for the next 2-3 days.  Keep the injury wrapped in a compression bandage or splint as long as the joint is painful or as instructed by your caregiver.  Do not use the injured joint until it is completely healed to prevent re-injury and chronic instability. Follow the instructions of your caregiver.  Long-term sprain management may require exercises and/or treatment by a physical therapist. Taping or special braces may help stabilize the joint until it is completely better. SEEK MEDICAL CARE IF:   You develop increased pain or swelling of the joint.  You develop increasing redness and warmth of the joint.  You develop a fever.  It becomes stiff.  Your hand or foot gets cold or numb. Document Released: 04/29/2004 Document Revised: 06/14/2011 Document Reviewed: 04/08/2008 [facility] Patient Information 2014 [place], [place].  Sprain A sprain is a tear in one of the strong, fibrous tissues that connect your bones (ligaments). The severity of the sprain depends on how much of the ligament is torn. The tear can be either partial or complete. CAUSES  Often, sprains are a result of a fall or an injury. The force of the impact causes the fibers of your ligament to stretch beyond their normal length. This excess tension causes the fibers of your ligament to tear. SYMPTOMS  You may have some loss of motion or increased pain within your normal range of motion.  Other symptoms include:  Bruising.  Tenderness.  Swelling. DIAGNOSIS  In order to diagnose a sprain, your caregiver will physically examine you to determine how torn the ligament is. Your caregiver may also suggest an X-ray exam to make sure no bones are broken. TREATMENT  If your ligament is only partially torn, treatment usually involves keeping the injured area in a fixed position (immobilization) for a short period. To do this, your caregiver will apply a bandage, cast, or splint to keep the area from moving until it heals. For a partially torn ligament, the healing process usually takes 2 to 3 weeks. If your ligament is completely torn, you may need surgery to reconnect the ligament to the bone or to reconstruct the ligament. After surgery, a cast or splint may be applied and will need to stay on for 4 to 6 weeks while your ligament heals. HOME CARE INSTRUCTIONS  Keep the injured area elevated to decrease swelling.  To ease pain and swelling, apply ice to your joint twice a day, for 2 to 3 days.  Put ice in a plastic bag.  Place a towel between your skin and the bag.  Leave the ice on for 15 minutes.  Only take over-the-counter or prescription medicine for pain as directed by your caregiver.  Do not leave the injured area unprotected until pain and stiffness go away (usually 3 to 4 weeks).  Do not allow your cast or splint to get wet. Cover your cast or splint with a plastic bag when you shower or bathe. Do  not swim.  Your caregiver may suggest exercises for you to do during your recovery to prevent or limit permanent stiffness. SEEK IMMEDIATE MEDICAL CARE IF:  Your cast or splint becomes damaged.  Your pain becomes worse. MAKE SURE YOU:  Understand these instructions.  Will watch your condition.  Will get help right away if you are not doing well or get worse. Document Released: 03/19/2000 Document Revised: 06/14/2011 Document Reviewed: 04/03/2011 [facility] Patient  Information 2014 [place], [place].

## 2014-03-15 ENCOUNTER — Emergency Department (HOSPITAL_COMMUNITY)
Admission: EM | Admit: 2014-03-15 | Discharge: 2014-03-15 | Disposition: A | Discharge status: Home / Self Care | Payer: No Typology Code available for payment source | Attending: Emergency Medicine | Admitting: Emergency Medicine

## 2014-03-15 ENCOUNTER — Encounter (HOSPITAL_COMMUNITY): Payer: Self-pay | Admitting: *Deleted

## 2014-03-15 DIAGNOSIS — N760 Acute vaginitis: Secondary | ICD-10-CM | POA: Insufficient documentation

## 2014-03-15 DIAGNOSIS — Z3202 Encounter for pregnancy test, result negative: Secondary | ICD-10-CM | POA: Insufficient documentation

## 2014-03-15 DIAGNOSIS — N390 Urinary tract infection, site not specified: Secondary | ICD-10-CM | POA: Insufficient documentation

## 2014-03-15 DIAGNOSIS — Z720 Tobacco use: Secondary | ICD-10-CM | POA: Insufficient documentation

## 2014-03-15 LAB — POC URINE PREG, ED: Preg Test, Ur: NEGATIVE

## 2014-03-15 LAB — WET PREP, GENITAL
Trich, Wet Prep: NONE SEEN
YEAST WET PREP: NONE SEEN

## 2014-03-15 LAB — CBC WITH DIFFERENTIAL/PLATELET
Basophils Absolute: 0 10*3/uL (ref 0.0–0.1)
Basophils Relative: 0 % (ref 0–1)
EOS ABS: 0.1 10*3/uL (ref 0.0–0.7)
EOS PCT: 1 % (ref 0–5)
HEMATOCRIT: 39.8 % (ref 36.0–46.0)
HEMOGLOBIN: 13.8 g/dL (ref 12.0–15.0)
LYMPHS ABS: 2.7 10*3/uL (ref 0.7–4.0)
LYMPHS PCT: 20 % (ref 12–46)
MCH: 32.4 pg (ref 26.0–34.0)
MCHC: 34.7 g/dL (ref 30.0–36.0)
MCV: 93.4 fL (ref 78.0–100.0)
MONO ABS: 1.1 10*3/uL — AB (ref 0.1–1.0)
MONOS PCT: 8 % (ref 3–12)
NEUTROS PCT: 71 % (ref 43–77)
Neutro Abs: 9.5 10*3/uL — ABNORMAL HIGH (ref 1.7–7.7)
Platelets: 258 10*3/uL (ref 150–400)
RBC: 4.26 MIL/uL (ref 3.87–5.11)
RDW: 14.1 % (ref 11.5–15.5)
WBC: 13.4 10*3/uL — AB (ref 4.0–10.5)

## 2014-03-15 LAB — COMPREHENSIVE METABOLIC PANEL
ALT: 12 U/L (ref 0–35)
ANION GAP: 14 (ref 5–15)
AST: 19 U/L (ref 0–37)
Albumin: 4 g/dL (ref 3.5–5.2)
Alkaline Phosphatase: 85 U/L (ref 39–117)
BUN: 5 mg/dL — AB (ref 6–23)
CALCIUM: 9.5 mg/dL (ref 8.4–10.5)
CO2: 23 mEq/L (ref 19–32)
Chloride: 103 mEq/L (ref 96–112)
Creatinine, Ser: 0.83 mg/dL (ref 0.50–1.10)
GFR calc Af Amer: >90 mL/min (ref >90)
Glucose, Bld: 82 mg/dL (ref 70–99)
Potassium: 4 mEq/L (ref 3.7–5.3)
Sodium: 140 mEq/L (ref 137–147)
Total Bilirubin: 0.4 mg/dL (ref 0.3–1.2)
Total Protein: 7.8 g/dL (ref 6.0–8.3)

## 2014-03-15 LAB — URINALYSIS, ROUTINE W REFLEX MICROSCOPIC
Bilirubin Urine: NEGATIVE
Glucose, UA: NEGATIVE mg/dL
Ketones, ur: NEGATIVE mg/dL
NITRITE: NEGATIVE
PROTEIN: NEGATIVE mg/dL
SPECIFIC GRAVITY, URINE: 1.025 (ref 1.005–1.030)
Urobilinogen, UA: 1 mg/dL (ref 0.0–1.0)
pH: 5.5 (ref 5.0–8.0)

## 2014-03-15 LAB — URINE MICROSCOPIC-ADD ON

## 2014-03-15 MED ORDER — LIDOCAINE HCL (PF) 1 % IJ SOLN
INTRAMUSCULAR | Status: AC
  Administered 2014-03-15: 19:00:00
  Filled 2014-03-15: qty 5

## 2014-03-15 MED ORDER — AZITHROMYCIN 250 MG PO TABS
1000.0000 mg | ORAL_TABLET | Freq: Once | ORAL | Status: AC
  Administered 2014-03-15: 1000 mg via ORAL
  Filled 2014-03-15: qty 4

## 2014-03-15 MED ORDER — CEFTRIAXONE SODIUM 250 MG IJ SOLR
250.0000 mg | Freq: Once | INTRAMUSCULAR | Status: AC
  Administered 2014-03-15: 250 mg via INTRAMUSCULAR
  Filled 2014-03-15: qty 250

## 2014-03-15 MED ORDER — NITROFURANTOIN MONOHYD MACRO 100 MG PO CAPS: 100.0000 mg | ORAL_CAPSULE | Freq: Two times a day (BID) | ORAL | Status: DC

## 2014-03-15 MED ORDER — TRAMADOL HCL 50 MG PO TABS: 50.0000 mg | ORAL_TABLET | Freq: Four times a day (QID) | ORAL | Status: DC | PRN

## 2014-03-15 MED ORDER — HYDROCODONE-ACETAMINOPHEN 5-325 MG PO TABS
2.0000 | ORAL_TABLET | Freq: Once | ORAL | Status: AC
  Administered 2014-03-15: 2 via ORAL
  Filled 2014-03-15: qty 2

## 2014-03-15 NOTE — ED Notes (Signed)
Pt reports severe lower abd pain x 2 days. Pain increases with movement and standing. Also having abnormal vaginal bleeding and discharge. Denies n/v/d.

## 2014-03-15 NOTE — Discharge Instructions (Signed)
Take Macrobid twice daily for 1 week as prescribed. You were treated today for both gonorrhea and chlamydia. If these tests result positive, you will be contacted and are then obligated to inform your partner for treatment. Take tramadol as directed for your pain.  Vaginitis Vaginitis is an inflammation of the vagina. It is most often caused by a change in the normal balance of the bacteria and yeast that live in the vagina. This change in balance causes an overgrowth of certain bacteria or yeast, which causes the inflammation. There are different types of vaginitis, but the most common types are:  Bacterial vaginosis.  Yeast infection (candidiasis).  Trichomoniasis vaginitis. This is a sexually transmitted infection (STI).  Viral vaginitis.  Atropic vaginitis.  Allergic vaginitis. CAUSES  The cause depends on the type of vaginitis. Vaginitis can be caused by:  Bacteria (bacterial vaginosis).  Yeast (yeast infection).  A parasite (trichomoniasis vaginitis)  A virus (viral vaginitis).  Low hormone levels (atrophic vaginitis). Low hormone levels can occur during pregnancy, breastfeeding, or after menopause.  Irritants, such as bubble baths, scented tampons, and feminine sprays (allergic vaginitis). Other factors can change the normal balance of the yeast and bacteria that live in the vagina. These include:  Antibiotic medicines.  Poor hygiene.  Diaphragms, vaginal sponges, spermicides, birth control pills, and intrauterine devices (IUD).  Sexual intercourse.  Infection.  Uncontrolled diabetes.  A weakened immune system. SYMPTOMS  Symptoms can vary depending on the cause of the vaginitis. Common symptoms include:  Abnormal vaginal discharge.  The discharge is white, gray, or yellow with bacterial vaginosis.  The discharge is thick, white, and cheesy with a yeast infection.  The discharge is frothy and yellow or greenish with trichomoniasis.  A bad vaginal  odor.  The odor is fishy with bacterial vaginosis.  Vaginal itching, pain, or swelling.  Painful intercourse.  Pain or burning when urinating. Sometimes, there are no symptoms. TREATMENT  Treatment will vary depending on the type of infection.   Bacterial vaginosis and trichomoniasis are often treated with antibiotic creams or pills.  Yeast infections are often treated with antifungal medicines, such as vaginal creams or suppositories.  Viral vaginitis has no cure, but symptoms can be treated with medicines that relieve discomfort. Your sexual partner should be treated as well.  Atrophic vaginitis may be treated with an estrogen cream, pill, suppository, or vaginal ring. If vaginal dryness occurs, lubricants and moisturizing creams may help. You may be told to avoid scented soaps, sprays, or douches.  Allergic vaginitis treatment involves quitting the use of the product that is causing the problem. Vaginal creams can be used to treat the symptoms. HOME CARE INSTRUCTIONS   Take all medicines as directed by your caregiver.  Keep your genital area clean and dry. Avoid soap and only rinse the area with water.  Avoid douching. It can remove the healthy bacteria in the vagina.  Do not use tampons or have sexual intercourse until your vaginitis has been treated. Use sanitary pads while you have vaginitis.  Wipe from front to back. This avoids the spread of bacteria from the rectum to the vagina.  Let air reach your genital area.  Wear cotton underwear to decrease moisture buildup.  Avoid wearing underwear while you sleep until your vaginitis is gone.  Avoid tight pants and underwear or nylons without a cotton panel.  Take off wet clothing (especially bathing suits) as soon as possible.  Use mild, non-scented products. Avoid using irritants, such as:  Scented feminine  sprays.  Fabric softeners.  Scented detergents.  Scented tampons.  Scented soaps or bubble  baths.  Practice safe sex and use condoms. Condoms may prevent the spread of trichomoniasis and viral vaginitis. SEEK MEDICAL CARE IF:   You have abdominal pain.  You have a fever or persistent symptoms for more than 2-3 days.  You have a fever and your symptoms suddenly get worse. Document Released: 01/17/2007 Document Revised: 12/15/2011 Document Reviewed: 09/02/2011 [facility] SurgeryExitCare Patient Information 2015 [place], [place]. This information is not intended to replace advice given to you by your health care provider. Make sure you discuss any questions you have with your health care provider.  Urinary Tract Infection Urinary tract infections (UTIs) can develop anywhere along your urinary tract. Your urinary tract is your body's drainage system for removing wastes and extra water. Your urinary tract includes two kidneys, two ureters, a bladder, and a urethra. Your kidneys are a pair of bean-shaped organs. Each kidney is about the size of your fist. They are located below your ribs, one on each side of your spine. CAUSES Infections are caused by microbes, which are microscopic organisms, including fungi, viruses, and bacteria. These organisms are so small that they can only be seen through a microscope. Bacteria are the microbes that most commonly cause UTIs. SYMPTOMS  Symptoms of UTIs may vary by age and gender of the patient and by the location of the infection. Symptoms in young women typically include a frequent and intense urge to urinate and a painful, burning feeling in the bladder or urethra during urination. Older women and men are more likely to be tired, shaky, and weak and have muscle aches and abdominal pain. A fever may mean the infection is in your kidneys. Other symptoms of a kidney infection include pain in your back or sides below the ribs, nausea, and vomiting. DIAGNOSIS To diagnose a UTI, your caregiver will ask you about your symptoms. Your caregiver also will ask to provide a urine  sample. The urine sample will be tested for bacteria and white blood cells. White blood cells are made by your body to help fight infection. TREATMENT  Typically, UTIs can be treated with medication. Because most UTIs are caused by a bacterial infection, they usually can be treated with the use of antibiotics. The choice of antibiotic and length of treatment depend on your symptoms and the type of bacteria causing your infection. HOME CARE INSTRUCTIONS  If you were prescribed antibiotics, take them exactly as your caregiver instructs you. Finish the medication even if you feel better after you have only taken some of the medication.  Drink enough water and fluids to keep your urine clear or pale yellow.  Avoid caffeine, tea, and carbonated beverages. They tend to irritate your bladder.  Empty your bladder often. Avoid holding urine for long periods of time.  Empty your bladder before and after sexual intercourse.  After a bowel movement, women should cleanse from front to back. Use each tissue only once. SEEK MEDICAL CARE IF:   You have back pain.  You develop a fever.  Your symptoms do not begin to resolve within 3 days. SEEK IMMEDIATE MEDICAL CARE IF:   You have severe back pain or lower abdominal pain.  You develop chills.  You have nausea or vomiting.  You have continued burning or discomfort with urination. MAKE SURE YOU:   Understand these instructions.  Will watch your condition.  Will get help right away if you are not doing well or  get worse. Document Released: 12/30/2004 Document Revised: 09/21/2011 Document Reviewed: 04/30/2011 [facility] Patient Information 2015 [place], [place]. This information is not intended to replace advice given to you by your health care provider. Make sure you discuss any questions you have with your health care provider.

## 2014-03-15 NOTE — ED Provider Notes (Signed)
CSN: [id number]     Arrival date & time 03/15/14  1635 History   First MD Initiated Contact with Patient 03/15/14 1738     Chief Complaint  Patient presents with  . Abdominal Pain  . Vaginal Discharge     (Consider location/radiation/quality/duration/timing/severity/associated sxs/prior Treatment) HPI Comments: 33 year old female presenting to the emergency department complaining of gradual onset, worsening lower abdominal pain 2 days. Pain is constant, described as a very severe cramp, worse with movement and standing. She's tried taking Tylenol with no relief. Admits to associated vaginal discharge with spotting. Last menstrual period began November 23 and was normal. She is sexually active with monogamous partner and does not use protection. Denies increased urinary frequency, urgency, dysuria, back pain, nausea, vomiting or fevers.  Patient is a 33 y.o. female presenting with abdominal pain and vaginal discharge. The history is provided by the patient.  Abdominal Pain Associated symptoms: vaginal discharge   Vaginal Discharge Associated symptoms: abdominal pain     History reviewed. No pertinent past medical history. Past Surgical History  Procedure Laterality Date  . Cesarean section     History reviewed. No pertinent family history. History  Substance Use Topics  . Smoking status: Current Some Day Smoker  . Smokeless tobacco: Not on file  . Alcohol Use: Yes     Comment: occassional   OB History    No data available     Review of Systems  Gastrointestinal: Positive for abdominal pain.  Genitourinary: Positive for vaginal discharge.  All other systems reviewed and are negative.     Allergies  Review of patient's allergies indicates no known allergies.  Home Medications   Prior to Admission medications   Medication Sig Start Date End Date Taking? Authorizing Provider  acetaminophen (TYLENOL) 325 MG tablet Take 650 mg by mouth every 6 (six) hours as needed for  mild pain.    Historical Provider, MD  cyclobenzaprine (FLEXERIL) 5 MG tablet Take 1 tablet (5 mg total) by mouth 3 (three) times daily as needed for muscle spasms. 06/01/13   Izola PriceFrances C. Sanford, PA-C  ibuprofen (ADVIL,MOTRIN) 800 MG tablet Take 1 tablet (800 mg total) by mouth every 6 (six) hours as needed for moderate pain. 06/01/13   Izola PriceFrances C. Sanford, PA-C  nitrofurantoin, macrocrystal-monohydrate, (MACROBID) 100 MG capsule Take 1 capsule (100 mg total) by mouth 2 (two) times daily. X 7 days 03/15/14   Kathrynn Speedobyn M Brayah Urquilla, PA-C  traMADol (ULTRAM) 50 MG tablet Take 1 tablet (50 mg total) by mouth every 6 (six) hours as needed. 03/15/14   Anyla Israelson M Bexlee Bergdoll, PA-C   BP 130/86 mmHg  Pulse 96  Temp(Src) 98.3 F (36.8 C) (Oral)  Resp 16  SpO2 100%  LMP 02/25/2014 Physical Exam  Constitutional: She is oriented to person, place, and time. She appears well-developed and well-nourished. No distress.  HENT:  Head: Normocephalic and atraumatic.  Mouth/Throat: Oropharynx is clear and moist.  Eyes: Conjunctivae are normal.  Neck: Normal range of motion. Neck supple.  Cardiovascular: Normal rate, regular rhythm and normal heart sounds.   Pulmonary/Chest: Effort normal and breath sounds normal.  Abdominal: Soft. Bowel sounds are normal. She exhibits no distension and no mass. There is no rebound and no guarding.  Suprapubic TTP. No peritoneal signs. No CVA tenderness.  Genitourinary: Uterus normal. Cervix exhibits no motion tenderness, no discharge and no friability. Right adnexum displays no mass, no tenderness and no fullness. Left adnexum displays no mass, no tenderness and no fullness. No tenderness in the  vagina. Vaginal discharge (scant, blood-tinged) found.  Musculoskeletal: Normal range of motion. She exhibits no edema.  Neurological: She is alert and oriented to person, place, and time.  Skin: Skin is warm and dry. She is not diaphoretic.  Psychiatric: She has a normal mood and affect. Her behavior is  normal.  Nursing note and vitals reviewed.   ED Course  Procedures (including critical care time) Labs Review Labs Reviewed  WET PREP, GENITAL - Abnormal; Notable for the following:    Clue Cells Wet Prep HPF POC FEW (*)    WBC, Wet Prep HPF POC TOO NUMEROUS TO COUNT (*)    All other components within normal limits  CBC WITH DIFFERENTIAL - Abnormal; Notable for the following:    WBC 13.4 (*)    Neutro Abs 9.5 (*)    Monocytes Absolute 1.1 (*)    All other components within normal limits  COMPREHENSIVE METABOLIC PANEL - Abnormal; Notable for the following:    BUN 5 (*)    All other components within normal limits  URINALYSIS, ROUTINE W REFLEX MICROSCOPIC - Abnormal; Notable for the following:    Color, Urine AMBER (*)    APPearance CLOUDY (*)    Hgb urine dipstick SMALL (*)    Leukocytes, UA LARGE (*)    All other components within normal limits  GC/CHLAMYDIA PROBE AMP  URINE MICROSCOPIC-ADD ON  POC URINE PREG, ED    Imaging Review No results found.   EKG Interpretation None      MDM   Final diagnoses:  Vaginitis  UTI (lower urinary tract infection)   Patient well-appearing and in no apparent distress. AFVSS. Abdomen is soft, suprapubic tenderness, no peritoneal signs. No CVA tenderness. Labs obtained in triage prior to patient being seen, leukocytosis of 13.4, no other acute findings. Urinalysis with large leukocytes, 11-20 white blood cells. Wet prep was too numerous to count white blood cells. No cervical motion tenderness or adnexal tenderness. Discussed findings with patient, agreeable to treatment prophylactically for GC/chlamydia. Discharge home on Macrobid. She is stable for discharge. Return precautions given. Patient states understanding of treatment care plan and is agreeable.  Kathrynn SpeedRobyn M Bernd Crom, PA-C 03/15/14 1906  Elwin MochaBlair Walden, MD 03/16/14 [phone]

## 2014-03-16 LAB — GC/CHLAMYDIA PROBE AMP
CT PROBE, AMP APTIMA: NEGATIVE
GC PROBE AMP APTIMA: POSITIVE — AB

## 2014-03-17 ENCOUNTER — Telehealth: Payer: Self-pay | Admitting: Emergency Medicine

## 2014-03-17 NOTE — Telephone Encounter (Signed)
+  Gonorrhea. Patient treated with Rocephin and Zithromax. [employer] faxed. 

## 2014-03-18 ENCOUNTER — Telehealth (HOSPITAL_COMMUNITY): Payer: Self-pay

## 2014-03-19 ENCOUNTER — Telehealth (HOSPITAL_BASED_OUTPATIENT_CLINIC_OR_DEPARTMENT_OTHER): Payer: Self-pay | Admitting: *Deleted

## 2014-03-31 ENCOUNTER — Telehealth (HOSPITAL_COMMUNITY): Payer: Self-pay

## 2014-03-31 NOTE — ED Notes (Signed)
Unable to contact pt by mail or telephone. Unable to communicate lab results or treatment changes. 

## 2016-11-11 ENCOUNTER — Emergency Department (HOSPITAL_COMMUNITY)
Admission: EM | Admit: 2016-11-11 | Discharge: 2016-11-12 | Disposition: A | Discharge status: Home / Self Care | Payer: Self-pay | Attending: Emergency Medicine | Admitting: Emergency Medicine

## 2016-11-11 ENCOUNTER — Encounter (HOSPITAL_COMMUNITY): Payer: Self-pay | Admitting: Emergency Medicine

## 2016-11-11 DIAGNOSIS — F17200 Nicotine dependence, unspecified, uncomplicated: Secondary | ICD-10-CM | POA: Insufficient documentation

## 2016-11-11 DIAGNOSIS — A599 Trichomoniasis, unspecified: Secondary | ICD-10-CM | POA: Insufficient documentation

## 2016-11-11 DIAGNOSIS — B9689 Other specified bacterial agents as the cause of diseases classified elsewhere: Secondary | ICD-10-CM | POA: Insufficient documentation

## 2016-11-11 DIAGNOSIS — N760 Acute vaginitis: Secondary | ICD-10-CM | POA: Insufficient documentation

## 2016-11-11 LAB — URINALYSIS, ROUTINE W REFLEX MICROSCOPIC
Bilirubin Urine: NEGATIVE
Glucose, UA: NEGATIVE mg/dL
Ketones, ur: NEGATIVE mg/dL
Nitrite: NEGATIVE
Protein, ur: NEGATIVE mg/dL
Specific Gravity, Urine: 1.02 (ref 1.005–1.030)
pH: 5 (ref 5.0–8.0)

## 2016-11-11 LAB — COMPREHENSIVE METABOLIC PANEL
ALK PHOS: 58 U/L (ref 38–126)
ALT: 15 U/L (ref 14–54)
AST: 16 U/L (ref 15–41)
Albumin: 4.1 g/dL (ref 3.5–5.0)
Anion gap: 9 (ref 5–15)
BUN: 10 mg/dL (ref 6–20)
CALCIUM: 9.4 mg/dL (ref 8.9–10.3)
CO2: 21 mmol/L — ABNORMAL LOW (ref 22–32)
CREATININE: 0.99 mg/dL (ref 0.44–1.00)
Chloride: 106 mmol/L (ref 101–111)
GFR calc non Af Amer: >60 mL/min (ref >60)
GLUCOSE: 92 mg/dL (ref 65–99)
Potassium: 4 mmol/L (ref 3.5–5.1)
Sodium: 136 mmol/L (ref 135–145)
Total Bilirubin: 0.2 mg/dL — ABNORMAL LOW (ref 0.3–1.2)
Total Protein: 7 g/dL (ref 6.5–8.1)

## 2016-11-11 LAB — LIPASE, BLOOD: Lipase: 23 U/L (ref 11–51)

## 2016-11-11 LAB — WET PREP, GENITAL
SPERM: NONE SEEN
Yeast Wet Prep HPF POC: NONE SEEN

## 2016-11-11 LAB — CBC
HCT: 39.6 % (ref 36.0–46.0)
Hemoglobin: 13.6 g/dL (ref 12.0–15.0)
MCH: 32 pg (ref 26.0–34.0)
MCHC: 34.3 g/dL (ref 30.0–36.0)
MCV: 93.2 fL (ref 78.0–100.0)
PLATELETS: 215 10*3/uL (ref 150–400)
RBC: 4.25 MIL/uL (ref 3.87–5.11)
RDW: 13.7 % (ref 11.5–15.5)
WBC: 10.8 10*3/uL — ABNORMAL HIGH (ref 4.0–10.5)

## 2016-11-11 LAB — HCG, QUANTITATIVE, PREGNANCY: hCG, Beta Chain, Quant, S: 1 m[IU]/mL (ref <5)

## 2016-11-11 NOTE — ED Triage Notes (Signed)
Pt c/o lower abd pain, nausea and vaginal discharge x's 4 days.  Also c/o vaginal itching

## 2016-11-11 NOTE — ED Provider Notes (Signed)
[facility]-EMERGENCY DEPT Provider Note   CSN: [id number] Arrival date & time: 11/11/16  1732     History   Chief Complaint Chief Complaint  Patient presents with  . Abdominal Pain  . Nausea  . Vaginal Discharge    HPI [name] is a 36 y.o. female.  HPI   [name] is a 36 y.o. female, with a history of depression, presenting to the ED with lower abdominal pain beginning this morning. Pain is aching, 8/10, suprapubic, nonradiating. Also endorses increased vaginal discharge as well as vaginal itching for the past 4 days. Has not tried any therapies for her complaints. LMP July 19. Patient is sexually active with 1 female partner. Denies history of STDs.  Denies fever/chills, N/V/C/D, urinary complaints, abnormal vaginal bleeding, dyspareunia, or any other complaints.      History reviewed. No pertinent past medical history.  Patient Active Problem List   Diagnosis Date Noted  . DEPRESSION, MAJOR, RECURRENT 06/02/2006  . TENSION HEADACHE 06/02/2006  . RHINITIS, ALLERGIC 06/02/2006  . ECZEMA, ATOPIC DERMATITIS 06/02/2006    Past Surgical History:  Procedure Laterality Date  . CESAREAN SECTION      OB History    No data available       Home Medications    Prior to Admission medications   Medication Sig Start Date End Date Taking? Authorizing Provider  cyclobenzaprine (FLEXERIL) 5 MG tablet Take 1 tablet (5 mg total) by mouth 3 (three) times daily as needed for muscle spasms. Patient not taking: Reported on 03/15/2014 06/01/13   Cherrie DistanceSanford, Frances, PA-C  ibuprofen (ADVIL,MOTRIN) 800 MG tablet Take 1 tablet (800 mg total) by mouth every 6 (six) hours as needed for moderate pain. Patient not taking: Reported on 03/15/2014 06/01/13   Cherrie DistanceSanford, Frances, PA-C  metroNIDAZOLE (FLAGYL) 500 MG tablet Take 1 tablet (500 mg total) by mouth 2 (two) times daily. 11/12/16   Bee Hammerschmidt C, PA-C  nitrofurantoin, macrocrystal-monohydrate, (MACROBID) 100 MG capsule Take 1 capsule  (100 mg total) by mouth 2 (two) times daily. X 7 days Patient not taking: Reported on 11/11/2016 03/15/14   Hess, Nada Boozerobyn M, PA-C  traMADol (ULTRAM) 50 MG tablet Take 1 tablet (50 mg total) by mouth every 6 (six) hours as needed. Patient not taking: Reported on 11/11/2016 03/15/14   Kathrynn SpeedHess, Robyn M, PA-C    Family History No family history on file.  Social History Social History  Substance Use Topics  . Smoking status: Current Some Day Smoker  . Smokeless tobacco: Never Used  . Alcohol use Yes     Comment: occassional     Allergies   Patient has no known allergies.   Review of Systems Review of Systems  Constitutional: Negative for chills and fever.  Gastrointestinal: Positive for abdominal pain. Negative for blood in stool, constipation, diarrhea, nausea and vomiting.  Genitourinary: Positive for vaginal discharge. Negative for vaginal bleeding and vaginal pain.       Vaginal itching  Musculoskeletal: Negative for back pain.  All other systems reviewed and are negative.    Physical Exam Updated Vital Signs BP 118/74 (BP Location: Right Arm)   Pulse 87   Temp 98.6 F (37 C) (Oral)   Resp 16   Ht 5\' 3"  (1.6 m)   Wt 60.8 kg (134 lb)   LMP 10/21/2016 (Exact Date)   SpO2 99%   BMI 23.74 kg/m   Physical Exam  Constitutional: She appears well-developed and well-nourished. No distress.  HENT:  Head: Normocephalic and  atraumatic.  Eyes: Conjunctivae are normal.  Neck: Neck supple.  Cardiovascular: Normal rate, regular rhythm, normal heart sounds and intact distal pulses.   Pulmonary/Chest: Effort normal and breath sounds normal. No respiratory distress.  Abdominal: Soft. There is tenderness in the suprapubic area. There is no guarding.  Patient verbally indicates minor suprapubic tenderness. But has no other reaction.  Genitourinary:  Genitourinary Comments: External genitalia normal Vagina with discharge - Scant, thick, white discharge Cervix  normal negative for  cervical motion tenderness Adnexa palpated, no masses, negative for tenderness noted Bladder palpated positive for tenderness Uterus palpated no masses, positive for tenderness  Otherwise normal female genitalia. Med Tech served as chaperone during exam.  Musculoskeletal: She exhibits no edema.  Lymphadenopathy:    She has no cervical adenopathy.  Neurological: She is alert.  Skin: Skin is warm and dry. She is not diaphoretic.  Psychiatric: She has a normal mood and affect. Her behavior is normal.  Nursing note and vitals reviewed.    ED Treatments / Results  Labs (all labs ordered are listed, but only abnormal results are displayed) Labs Reviewed  WET PREP, GENITAL - Abnormal; Notable for the following:       Result Value   Trich, Wet Prep PRESENT (*)    Clue Cells Wet Prep HPF POC PRESENT (*)    WBC, Wet Prep HPF POC MANY (*)    All other components within normal limits  COMPREHENSIVE METABOLIC PANEL - Abnormal; Notable for the following:    CO2 21 (*)    Total Bilirubin 0.2 (*)    All other components within normal limits  CBC - Abnormal; Notable for the following:    WBC 10.8 (*)    All other components within normal limits  URINALYSIS, ROUTINE W REFLEX MICROSCOPIC - Abnormal; Notable for the following:    Hgb urine dipstick SMALL (*)    Leukocytes, UA LARGE (*)    Bacteria, UA RARE (*)    Squamous Epithelial / LPF 0-5 (*)    All other components within normal limits  LIPASE, BLOOD  HCG, QUANTITATIVE, PREGNANCY  RPR  HIV ANTIBODY (ROUTINE TESTING)  GC/CHLAMYDIA PROBE AMP ([place]) NOT AT [facility]    EKG  EKG Interpretation None       Radiology No results found.  Procedures Pelvic exam Date/Time: 11/11/2016 11:08 PM Performed by: Anselm Pancoast Authorized by: Harolyn Rutherford C  Consent: Verbal consent obtained. Risks and benefits: risks, benefits and alternatives were discussed Consent given by: patient Patient understanding: patient states understanding of  the procedure being performed Patient consent: the patient's understanding of the procedure matches consent given Procedure consent: procedure consent matches procedure scheduled Patient identity confirmed: verbally with patient Local anesthesia used: no  Anesthesia: Local anesthesia used: no  Sedation: Patient sedated: no Patient tolerance: Patient tolerated the procedure well with no immediate complications    (including critical care time)  Medications Ordered in ED Medications  cefTRIAXone (ROCEPHIN) injection 250 mg (250 mg Intramuscular Given 11/12/16 0023)  azithromycin (ZITHROMAX) tablet 1,000 mg (1,000 mg Oral Given 11/12/16 0022)  metroNIDAZOLE (FLAGYL) tablet 500 mg (500 mg Oral Given 11/12/16 0022)  lidocaine (PF) (XYLOCAINE) 1 % injection (0.9 mLs  Given 11/12/16 0026)     Initial Impression / Assessment and Plan / ED Course  I have reviewed the triage vital signs and the nursing notes.  Pertinent labs & imaging results that were available during my care of the patient were reviewed by me and considered in  my medical decision making (see chart for details).     Patient presents with suprapubic discomfort and vaginal discharge. Evidence of BV and Trichomonas. Patient notified of these results and counseled on their meaning. Questions answered. Flagyl to treat both. OB/GYN versus PCP follow-up. Return precautions discussed.    Final Clinical Impressions(s) / ED Diagnoses   Final diagnoses:  BV (bacterial vaginosis)  Trichomonas infection    New Prescriptions New Prescriptions   METRONIDAZOLE (FLAGYL) 500 MG TABLET    Take 1 tablet (500 mg total) by mouth 2 (two) times daily.     Anselm Pancoast, PA-C 11/12/16 Marlyce Huge    Azalia Bilis, MD 11/12/16 [phone]

## 2016-11-12 LAB — RPR: RPR Ser Ql: NONREACTIVE

## 2016-11-12 LAB — HIV ANTIBODY (ROUTINE TESTING W REFLEX): HIV Screen 4th Generation wRfx: NONREACTIVE

## 2016-11-12 MED ORDER — LIDOCAINE HCL (PF) 1 % IJ SOLN
INTRAMUSCULAR | Status: AC
  Administered 2016-11-12: 0.9 mL
  Filled 2016-11-12: qty 5

## 2016-11-12 MED ORDER — METRONIDAZOLE 500 MG PO TABS: 500.0000 mg | ORAL_TABLET | Freq: Two times a day (BID) | ORAL | 0 refills | Status: DC

## 2016-11-12 MED ORDER — AZITHROMYCIN 250 MG PO TABS
1000.0000 mg | ORAL_TABLET | Freq: Once | ORAL | Status: AC
  Administered 2016-11-12: 1000 mg via ORAL
  Filled 2016-11-12: qty 4

## 2016-11-12 MED ORDER — CEFTRIAXONE SODIUM 250 MG IJ SOLR
250.0000 mg | Freq: Once | INTRAMUSCULAR | Status: AC
  Administered 2016-11-12: 250 mg via INTRAMUSCULAR
  Filled 2016-11-12: qty 250

## 2016-11-12 MED ORDER — METRONIDAZOLE 500 MG PO TABS
500.0000 mg | ORAL_TABLET | Freq: Once | ORAL | Status: AC
  Administered 2016-11-12: 500 mg via ORAL
  Filled 2016-11-12: qty 1

## 2016-11-12 NOTE — Discharge Instructions (Signed)
You tested positive for Trichomonas and bacterial vaginosis. Trichomonas is almost always a sexually transmitted infection.  Bacterial vaginosis, however, can be caused by some sexual contact, but many times is caused by things like antibiotic use, changes in hygiene practices, changes in soaps, or douching. You have also been tested for other STDs. Some of these results are still pending. Any abnormalities will be called to you. You have been prophylactically treated for gonorrhea and Chlamydia. This does not mean you necessarily have these diseases, treatment is precautionary. Be sure to follow safe sex practices, including monogamy and/or condom use. For the treatment to be fully effective, avoid all sexual contact for at least 2 weeks after medication administration.  Please take all of your antibiotics until finished!   You may develop abdominal discomfort or diarrhea from the antibiotic.  You may help offset this with probiotics which you can buy or get in yogurt. Do not eat or take the probiotics until 2 hours after your antibiotic.   Follow-up with your primary care provider or OB/GYN for any further management of this issue.

## 2016-11-12 NOTE — ED Notes (Signed)
Pt departed in NAD, refused use of wheelchair.  

## 2016-11-15 LAB — GC/CHLAMYDIA PROBE AMP (~~LOC~~) NOT AT ARMC
CHLAMYDIA, DNA PROBE: NEGATIVE
NEISSERIA GONORRHEA: NEGATIVE

## 2019-07-13 ENCOUNTER — Ambulatory Visit (HOSPITAL_COMMUNITY)
Admission: EM | Admit: 2019-07-13 | Discharge: 2019-07-13 | Disposition: A | Discharge status: Home / Self Care | Payer: Self-pay | Attending: Family Medicine | Admitting: Family Medicine

## 2019-07-13 ENCOUNTER — Other Ambulatory Visit: Payer: Self-pay

## 2019-07-13 ENCOUNTER — Encounter (HOSPITAL_COMMUNITY): Payer: Self-pay

## 2019-07-13 DIAGNOSIS — N760 Acute vaginitis: Secondary | ICD-10-CM | POA: Insufficient documentation

## 2019-07-13 DIAGNOSIS — Z3202 Encounter for pregnancy test, result negative: Secondary | ICD-10-CM

## 2019-07-13 LAB — POCT URINALYSIS DIP (DEVICE)
Bilirubin Urine: NEGATIVE
Glucose, UA: NEGATIVE mg/dL
Hgb urine dipstick: NEGATIVE
Ketones, ur: NEGATIVE mg/dL
Nitrite: NEGATIVE
Protein, ur: NEGATIVE mg/dL
Specific Gravity, Urine: >=1.03 (ref 1.005–1.030)
Urobilinogen, UA: 0.2 mg/dL (ref 0.0–1.0)
pH: 5 (ref 5.0–8.0)

## 2019-07-13 LAB — POC URINE PREG, ED: Preg Test, Ur: NEGATIVE

## 2019-07-13 LAB — POCT PREGNANCY, URINE: Preg Test, Ur: NEGATIVE

## 2019-07-13 MED ORDER — FLUCONAZOLE 150 MG PO TABS: 150.0000 mg | ORAL_TABLET | Freq: Once | ORAL | 0 refills | Status: AC

## 2019-07-13 MED ORDER — FLUCONAZOLE 150 MG PO TABS: 150.0000 mg | ORAL_TABLET | Freq: Once | ORAL | 0 refills | Status: DC

## 2019-07-13 NOTE — ED Provider Notes (Signed)
[place]    CSN: [id number] Arrival date & time: 07/13/19  1846      History   Chief Complaint Chief Complaint  Patient presents with  . vagianl irritation    HPI [name] is a 39 y.o. female.   HPI  Patient is concern regarding vaginal irritation ongoing x 5 days. Denies any recent changes in sexual partners or concern for STD. Endorses symptoms are similar to a prior yeast infection. Denies vaginal lesions, dysuria, nausea, or vomiting. She has attempted relief with OTC yeast treatment without relief of symptoms.   History reviewed. No pertinent past medical history.  Patient Active Problem List   Diagnosis Date Noted  . DEPRESSION, MAJOR, RECURRENT 06/02/2006  . TENSION HEADACHE 06/02/2006  . RHINITIS, ALLERGIC 06/02/2006  . ECZEMA, ATOPIC DERMATITIS 06/02/2006    Past Surgical History:  Procedure Laterality Date  . CESAREAN SECTION      OB History   No obstetric history on file.     Home Medications    Prior to Admission medications   Medication Sig Start Date End Date Taking? Authorizing Provider  cyclobenzaprine (FLEXERIL) 5 MG tablet Take 1 tablet (5 mg total) by mouth 3 (three) times daily as needed for muscle spasms. Patient not taking: Reported on 03/15/2014 06/01/13   Ignacia Felling, PA-C  fluconazole (DIFLUCAN) 150 MG tablet Take 1 tablet (150 mg total) by mouth once for 1 dose. Repeat if needed 07/13/19 07/13/19  Scot Jun, FNP  ibuprofen (ADVIL,MOTRIN) 800 MG tablet Take 1 tablet (800 mg total) by mouth every 6 (six) hours as needed for moderate pain. Patient not taking: Reported on 03/15/2014 06/01/13   Ignacia Felling, PA-C  metroNIDAZOLE (FLAGYL) 500 MG tablet Take 1 tablet (500 mg total) by mouth 2 (two) times daily. 11/12/16   Joy, Shawn C, PA-C  nitrofurantoin, macrocrystal-monohydrate, (MACROBID) 100 MG capsule Take 1 capsule (100 mg total) by mouth 2 (two) times daily. X 7 days Patient not taking: Reported on 11/11/2016  03/15/14   Hess, Hessie Diener, PA-C  traMADol (ULTRAM) 50 MG tablet Take 1 tablet (50 mg total) by mouth every 6 (six) hours as needed. Patient not taking: Reported on 11/11/2016 03/15/14   Carman Ching, PA-C    Family History No family history on file.  Social History Social History   Tobacco Use  . Smoking status: Current Some Day Smoker  . Smokeless tobacco: Never Used  Substance Use Topics  . Alcohol use: Yes    Comment: occassional  . Drug use: No     Allergies   Patient has no known allergies.   Review of Systems Review of Systems Pertinent negatives listed in HPI Physical Exam Triage Vital Signs ED Triage Vitals  Enc Vitals Group     BP 07/13/19 1941 129/82     Pulse Rate 07/13/19 1941 75     Resp 07/13/19 1941 16     Temp 07/13/19 1941 98.1 F (36.7 C)     Temp Source 07/13/19 1941 Oral     SpO2 07/13/19 1941 98 %     Weight 07/13/19 1948 130 lb (59 kg)     Height 07/13/19 1948 5\' 3"  (1.6 m)     Head Circumference --      Peak Flow --      Pain Score 07/13/19 1948 0     Pain Loc --      Pain Edu? --      Excl. in [place]? --  No data found.  Updated Vital Signs BP 129/82 (BP Location: Left Arm)   Pulse 75   Temp 98.1 F (36.7 C) (Oral)   Resp 16   Ht 5\' 3"  (1.6 m)   Wt 130 lb (59 kg)   SpO2 98%   BMI 23.03 kg/m   Visual Acuity Right Eye Distance:   Left Eye Distance:   Bilateral Distance:    Right Eye Near:   Left Eye Near:    Bilateral Near:     Physical Exam General appearance: alert, well developed, well nourished, cooperative and in no distress Head: Normocephalic, without obvious abnormality, atraumatic Respiratory: Respirations even and unlabored, normal respiratory rate Heart: rate and rhythm normal.  Abdomen: BS +, no distention, no rebound tenderness, or no mass Skin: Skin color, texture, turgor normal. No rashes seen  Psych: Appropriate mood and affect. Neurologic: Alert, oriented to person, place, and time, thought content  appropriate. Vaginal specimen self-collected. UC Treatments / Results  Labs (all labs ordered are listed, but only abnormal results are displayed) Labs Reviewed  POCT URINALYSIS DIP (DEVICE) - Abnormal; Notable for the following components:      Result Value   Leukocytes,Ua TRACE (*)    All other components within normal limits  POC URINE PREG, ED  POCT PREGNANCY, URINE  CERVICOVAGINAL ANCILLARY ONLY    EKG  Radiology No results found.  Procedures Procedures (including critical care time)  Medications Ordered in UC Medications - No data to display  Initial Impression / Assessment and Plan / UC Course  I have reviewed the triage vital signs and the nursing notes.  Pertinent labs & imaging results that were available during my care of the patient were reviewed by me and considered in my medical decision making (see chart for details).   Vaginal cytology pending. UA unremarkable and urine pregnancy negative. Trial Diflucan 150 mg once and may repeat in 3 days if symptoms do not completely resolve. Follow-up if symptoms worsen or do not resolve.  Final Clinical Impressions(s) / UC Diagnoses   Final diagnoses:  Vaginitis and vulvovaginitis   Discharge Instructions   None    ED Prescriptions    Medication Sig Dispense Auth. Provider   fluconazole (DIFLUCAN) 150 MG tablet  (Status: Discontinued) Take 1 tablet (150 mg total) by mouth once for 1 dose. Repeat if needed 2 tablet , FNP   fluconazole (DIFLUCAN) 150 MG tablet Take 1 tablet (150 mg total) by mouth once for 1 dose. Repeat if needed 2 tablet Bing Neighbors, FNP     PDMP not reviewed this encounter.   Bing Neighbors, Bing Neighbors 07/15/19 [phone]

## 2019-07-13 NOTE — ED Triage Notes (Signed)
Pt c/o vaginal irritationx5 days. Pt denies d/c and odor

## 2019-07-16 ENCOUNTER — Telehealth (HOSPITAL_COMMUNITY): Payer: Self-pay | Admitting: Emergency Medicine

## 2019-07-16 ENCOUNTER — Ambulatory Visit (HOSPITAL_COMMUNITY)
Admission: EM | Admit: 2019-07-16 | Discharge: 2019-07-16 | Disposition: A | Discharge status: Home / Self Care | Payer: Self-pay

## 2019-07-16 ENCOUNTER — Other Ambulatory Visit: Payer: Self-pay

## 2019-07-16 LAB — CERVICOVAGINAL ANCILLARY ONLY
Bacterial Vaginitis (gardnerella): POSITIVE — AB
Candida Glabrata: NEGATIVE
Candida Vaginitis: POSITIVE — AB
Chlamydia: NEGATIVE
Comment: NEGATIVE
Comment: NEGATIVE
Comment: NEGATIVE
Comment: NEGATIVE
Comment: NEGATIVE
Comment: NORMAL
Neisseria Gonorrhea: NEGATIVE
Trichomonas: POSITIVE — AB

## 2019-07-16 MED ORDER — FLUCONAZOLE 150 MG PO TABS: 150.0000 mg | ORAL_TABLET | Freq: Every day | ORAL | 0 refills | Status: DC

## 2019-07-16 MED ORDER — METRONIDAZOLE 500 MG PO TABS
500.0000 mg | ORAL_TABLET | Freq: Two times a day (BID) | ORAL | 0 refills | Status: DC
Start: 2019-07-16 — End: 2020-04-28

## 2019-07-17 ENCOUNTER — Telehealth (HOSPITAL_COMMUNITY): Payer: Self-pay

## 2019-07-17 MED ORDER — METRONIDAZOLE 500 MG PO TABS: 500.0000 mg | ORAL_TABLET | Freq: Two times a day (BID) | ORAL | 0 refills | Status: DC

## 2020-04-28 ENCOUNTER — Ambulatory Visit (HOSPITAL_COMMUNITY)
Admission: EM | Admit: 2020-04-28 | Discharge: 2020-04-28 | Disposition: A | Discharge status: Home / Self Care | Payer: Self-pay | Attending: Emergency Medicine | Admitting: Emergency Medicine

## 2020-04-28 ENCOUNTER — Encounter (HOSPITAL_COMMUNITY): Payer: Self-pay

## 2020-04-28 ENCOUNTER — Other Ambulatory Visit: Payer: Self-pay

## 2020-04-28 DIAGNOSIS — N898 Other specified noninflammatory disorders of vagina: Secondary | ICD-10-CM | POA: Insufficient documentation

## 2020-04-28 DIAGNOSIS — M436 Torticollis: Secondary | ICD-10-CM | POA: Insufficient documentation

## 2020-04-28 DIAGNOSIS — R300 Dysuria: Secondary | ICD-10-CM | POA: Insufficient documentation

## 2020-04-28 LAB — POCT URINALYSIS DIPSTICK, ED / UC
Bilirubin Urine: NEGATIVE
Glucose, UA: NEGATIVE mg/dL
Ketones, ur: NEGATIVE mg/dL
Nitrite: NEGATIVE
Protein, ur: NEGATIVE mg/dL
Specific Gravity, Urine: >=1.03 (ref 1.005–1.030)
Urobilinogen, UA: 0.2 mg/dL (ref 0.0–1.0)
pH: 5.5 (ref 5.0–8.0)

## 2020-04-28 LAB — POC URINE PREG, ED: Preg Test, Ur: NEGATIVE

## 2020-04-28 MED ORDER — CEPHALEXIN 500 MG PO CAPS: 500.0000 mg | ORAL_CAPSULE | Freq: Two times a day (BID) | ORAL | 0 refills | Status: AC

## 2020-04-28 MED ORDER — METHOCARBAMOL 500 MG PO TABS: 500.0000 mg | ORAL_TABLET | Freq: Two times a day (BID) | ORAL | 0 refills | Status: DC

## 2020-04-28 NOTE — ED Provider Notes (Signed)
[facility]    CSN: [id number] Arrival date & time: 04/28/20  0815      History   Chief Complaint Chief Complaint  Patient presents with  . Urinary Urgency  . Dysuria    HPI [name] is a 40 y.o. female.   Patient presents with 1 week history of dysuria, frequency, urgency.  She also reports thick vaginal discharge when her symptoms first started which has resolved now; she requests STD testing.  Patient also reports neck pain x2 days; worse with ROM; feels like stiff muscles.  She denies fever, chills, sore throat, cough, shortness of breath, abdominal pain, back pain, pelvic pain, or other symptoms.  No treatments attempted at home.  Her medical history includes depression, tension headache, allergic rhinitis, eczema.  The history is provided by the patient and medical records.    History reviewed. No pertinent past medical history.  Patient Active Problem List   Diagnosis Date Noted  . DEPRESSION, MAJOR, RECURRENT 06/02/2006  . TENSION HEADACHE 06/02/2006  . RHINITIS, ALLERGIC 06/02/2006  . ECZEMA, ATOPIC DERMATITIS 06/02/2006    Past Surgical History:  Procedure Laterality Date  . CESAREAN SECTION      OB History   No obstetric history on file.      Home Medications    Prior to Admission medications   Medication Sig Start Date End Date Taking? Authorizing Provider  cephALEXin (KEFLEX) 500 MG capsule Take 1 capsule (500 mg total) by mouth 2 (two) times daily for 5 days. 04/28/20 05/03/20 Yes Mickie Bail, NP  methocarbamol (ROBAXIN) 500 MG tablet Take 1 tablet (500 mg total) by mouth 2 (two) times daily. 04/28/20  Yes Mickie Bail, NP    Family History History reviewed. No pertinent family history.  Social History Social History   Tobacco Use  . Smoking status: Current Some Day Smoker  . Smokeless tobacco: Never Used  Substance Use Topics  . Alcohol use: Yes    Comment: occassional  . Drug use: No     Allergies   Patient has no  known allergies.   Review of Systems Review of Systems  Constitutional: Negative for chills and fever.  HENT: Negative for ear pain and sore throat.   Eyes: Negative for pain and visual disturbance.  Respiratory: Negative for cough and shortness of breath.   Cardiovascular: Negative for chest pain and palpitations.  Gastrointestinal: Negative for abdominal pain, diarrhea, nausea and vomiting.  Genitourinary: Positive for dysuria, frequency, urgency and vaginal discharge. Negative for flank pain, hematuria and pelvic pain.  Musculoskeletal: Positive for neck pain. Negative for arthralgias and back pain.  Skin: Negative for color change and rash.  Neurological: Negative for seizures and syncope.  All other systems reviewed and are negative.    Physical Exam Triage Vital Signs ED Triage Vitals  Enc Vitals Group     BP      Pulse      Resp      Temp      Temp src      SpO2      Weight      Height      Head Circumference      Peak Flow      Pain Score      Pain Loc      Pain Edu?      Excl. in GC?    No data found.  Updated Vital Signs BP 115/78 (BP Location: Left Arm)   Pulse 86  Temp 98.9 F (37.2 C) (Oral)   Resp 16   LMP 03/29/2020 (Exact Date)   SpO2 97%   Visual Acuity Right Eye Distance:   Left Eye Distance:   Bilateral Distance:    Right Eye Near:   Left Eye Near:    Bilateral Near:     Physical Exam Vitals and nursing note reviewed.  Constitutional:      General: She is not in acute distress.    Appearance: She is well-developed and well-nourished. She is not ill-appearing.  HENT:     Head: Normocephalic and atraumatic.     Right Ear: Tympanic membrane normal.     Left Ear: Tympanic membrane normal.     Nose: Nose normal.     Mouth/Throat:     Mouth: Mucous membranes are moist.     Pharynx: Oropharynx is clear.  Eyes:     Conjunctiva/sclera: Conjunctivae normal.  Neck:     Comments: Mild tenderness of bilateral trapezius and  sternocleidomastoid muscles.  Decreased ROM due to muscle tightness and discomfort. Cardiovascular:     Rate and Rhythm: Normal rate and regular rhythm.     Heart sounds: Normal heart sounds.  Pulmonary:     Effort: Pulmonary effort is normal. No respiratory distress.     Breath sounds: Normal breath sounds.  Abdominal:     General: Bowel sounds are normal.     Palpations: Abdomen is soft.     Tenderness: There is abdominal tenderness in the suprapubic area. There is no right CVA tenderness, left CVA tenderness, guarding or rebound.     Comments: Mild suprapubic tenderness. No rebound or guarding.   Musculoskeletal:        General: No edema. Normal range of motion.     Cervical back: Neck supple. Tenderness present. No rigidity.  Skin:    General: Skin is warm and dry.     Findings: No rash.  Neurological:     General: No focal deficit present.     Mental Status: She is alert and oriented to person, place, and time.     Gait: Gait normal.  Psychiatric:        Mood and Affect: Mood and affect and mood normal.        Behavior: Behavior normal.      UC Treatments / Results  Labs (all labs ordered are listed, but only abnormal results are displayed) Labs Reviewed  POCT URINALYSIS DIPSTICK, ED / UC - Abnormal; Notable for the following components:      Result Value   Hgb urine dipstick TRACE (*)    Leukocytes,Ua TRACE (*)    All other components within normal limits  URINE CULTURE  POC URINE PREG, ED  CERVICOVAGINAL ANCILLARY ONLY    EKG   Radiology No results found.  Procedures Procedures (including critical care time)  Medications Ordered in UC Medications - No data to display  Initial Impression / Assessment and Plan / UC Course  I have reviewed the triage vital signs and the nursing notes.  Pertinent labs & imaging results that were available during my care of the patient were reviewed by me and considered in my medical decision making (see chart for  details).   Dysuria, vaginal discharge, torticollis.  Urine culture pending.  Urine pregnancy negative.  Treating with 5-day course of Keflex.  Discussed with patient that we will call her if the urine culture results showed the need to change or discontinue her antibiotic.  Vaginal self swab obtained  by patient for STD testing.  Instructed patient to abstain from sexual activity until the test results are back.  Discussed that we will call her if her STD test results are positive requiring treatment.  Treatment for torticollis with Robaxin and OTC ibuprofen.  Precautions for drowsiness with Robaxin discussed.  Instructed patient to follow-up with her PCP if her symptoms are not improving.  She agrees to plan of care.   Final Clinical Impressions(s) / UC Diagnoses   Final diagnoses:  Dysuria  Vaginal discharge  Torticollis     Discharge Instructions     Take the antibiotic as directed.  The urine culture is pending.  We will call you if it shows the need to change or discontinue your antibiotic.    Take ibuprofen as needed for your pain.  Take the muscle relaxer as needed for muscle spasm; Do not drive, operate machinery, or drink alcohol with this medication as it may make you drowsy.    Follow up with your primary care provider if your symptoms are not improving.            ED Prescriptions    Medication Sig Dispense Auth. Provider   methocarbamol (ROBAXIN) 500 MG tablet Take 1 tablet (500 mg total) by mouth 2 (two) times daily. 20 tablet Mickie Bail, NP   cephALEXin (KEFLEX) 500 MG capsule Take 1 capsule (500 mg total) by mouth 2 (two) times daily for 5 days. 10 capsule Mickie Bail, NP     PDMP not reviewed this encounter.   Mickie Bail, NP 04/28/20 [phone]

## 2020-04-28 NOTE — ED Notes (Signed)
Pt states she would like to be tested for STI.

## 2020-04-28 NOTE — Discharge Instructions (Addendum)
Take the antibiotic as directed.  The urine culture is pending.  We will call you if it shows the need to change or discontinue your antibiotic.    Take ibuprofen as needed for your pain.  Take the muscle relaxer as needed for muscle spasm; Do not drive, operate machinery, or drink alcohol with this medication as it may make you drowsy.    Follow up with your primary care provider if your symptoms are not improving.

## 2020-04-28 NOTE — ED Triage Notes (Signed)
Pt c/o cervical pain, frequency, urgency and dysuria X 1 week. She states after urination she feels pain. Pt states this morning she woke up with neck pain. Pt denies lower and abdominal pain.

## 2020-04-29 ENCOUNTER — Telehealth (HOSPITAL_COMMUNITY): Payer: Self-pay | Admitting: Emergency Medicine

## 2020-04-29 LAB — URINE CULTURE: Culture: 10000 — AB

## 2020-04-29 LAB — CERVICOVAGINAL ANCILLARY ONLY
Bacterial Vaginitis (gardnerella): POSITIVE — AB
Candida Glabrata: NEGATIVE
Candida Vaginitis: POSITIVE — AB
Chlamydia: POSITIVE — AB
Comment: NEGATIVE
Comment: NEGATIVE
Comment: NEGATIVE
Comment: NEGATIVE
Comment: NEGATIVE
Comment: NORMAL
Neisseria Gonorrhea: NEGATIVE
Trichomonas: NEGATIVE

## 2020-04-29 MED ORDER — DOXYCYCLINE HYCLATE 100 MG PO CAPS: 100.0000 mg | ORAL_CAPSULE | Freq: Two times a day (BID) | ORAL | 0 refills | Status: AC

## 2020-04-29 MED ORDER — FLUCONAZOLE 150 MG PO TABS: 150.0000 mg | ORAL_TABLET | Freq: Once | ORAL | 0 refills | Status: AC

## 2020-07-16 ENCOUNTER — Other Ambulatory Visit: Payer: Self-pay

## 2020-07-16 ENCOUNTER — Ambulatory Visit: Payer: Self-pay | Attending: Nurse Practitioner | Admitting: Nurse Practitioner

## 2020-10-29 ENCOUNTER — Encounter (HOSPITAL_COMMUNITY): Payer: Self-pay

## 2020-10-29 ENCOUNTER — Other Ambulatory Visit: Payer: Self-pay

## 2020-10-29 ENCOUNTER — Ambulatory Visit (HOSPITAL_COMMUNITY)
Admission: EM | Admit: 2020-10-29 | Discharge: 2020-10-29 | Disposition: A | Discharge status: Home / Self Care | Payer: PRIVATE HEALTH INSURANCE | Attending: Medical Oncology | Admitting: Medical Oncology

## 2020-10-29 DIAGNOSIS — R519 Headache, unspecified: Secondary | ICD-10-CM | POA: Diagnosis present

## 2020-10-29 DIAGNOSIS — N898 Other specified noninflammatory disorders of vagina: Secondary | ICD-10-CM

## 2020-10-29 DIAGNOSIS — R110 Nausea: Secondary | ICD-10-CM | POA: Insufficient documentation

## 2020-10-29 LAB — POC URINE PREG, ED: Preg Test, Ur: NEGATIVE

## 2020-10-29 LAB — POCT URINALYSIS DIPSTICK, ED / UC
Bilirubin Urine: NEGATIVE
Glucose, UA: NEGATIVE mg/dL
Ketones, ur: NEGATIVE mg/dL
Leukocytes,Ua: NEGATIVE
Nitrite: NEGATIVE
Protein, ur: NEGATIVE mg/dL
Specific Gravity, Urine: >=1.03 (ref 1.005–1.030)
Urobilinogen, UA: 1 mg/dL (ref 0.0–1.0)
pH: 6 (ref 5.0–8.0)

## 2020-10-29 MED ORDER — METHOCARBAMOL 500 MG PO TABS: 500.0000 mg | ORAL_TABLET | Freq: Two times a day (BID) | ORAL | 0 refills | Status: DC

## 2020-10-29 MED ORDER — KETOROLAC TROMETHAMINE 30 MG/ML IJ SOLN
30.0000 mg | Freq: Once | INTRAMUSCULAR | Status: AC
  Administered 2020-10-29: 30 mg via INTRAMUSCULAR

## 2020-10-29 MED ORDER — ONDANSETRON 4 MG PO TBDP: 4.0000 mg | ORAL_TABLET | Freq: Three times a day (TID) | ORAL | 0 refills | Status: AC | PRN

## 2020-10-29 MED ORDER — KETOROLAC TROMETHAMINE 30 MG/ML IJ SOLN
INTRAMUSCULAR | Status: AC
  Filled 2020-10-29: qty 1

## 2020-10-29 NOTE — ED Triage Notes (Signed)
Pt presents with some vaginal irritation and dryness X 1 week.  Pt also complains of ongoing headache with some nausea.

## 2020-10-29 NOTE — ED Provider Notes (Signed)
[facility]    CSN: [id number] Arrival date & time: 10/29/20  1122      History   Chief Complaint Chief Complaint  Patient presents with   Vaginitis   Headache    HPI [name] is a 40 y.o. female.   HPI  Vaginitis: Pt reports vaginal discharge and irritation for the past 5 days. No dysuria, pelvic pain, fevers, abdominal pain. She has not tried anything for symptoms. No known STI exposure but would like vaginal testing.   Headache: Patient has a history of chronic tension migraines. She reports a left sided mild dull headache. Accompanied with mild nausea. NO head injury, fever, visual changes, photophobia, neck pain, flu like symptoms. She has tried tylenol with some improvement.   History reviewed. No pertinent past medical history.  Patient Active Problem List   Diagnosis Date Noted   DEPRESSION, MAJOR, RECURRENT 06/02/2006   TENSION HEADACHE 06/02/2006   RHINITIS, ALLERGIC 06/02/2006   ECZEMA, ATOPIC DERMATITIS 06/02/2006    Past Surgical History:  Procedure Laterality Date   CESAREAN SECTION      OB History   No obstetric history on file.      Home Medications    Prior to Admission medications   Medication Sig Start Date End Date Taking? Authorizing Provider  methocarbamol (ROBAXIN) 500 MG tablet Take 1 tablet (500 mg total) by mouth 2 (two) times daily. 04/28/20   Mickie Bail, NP    Family History Family History  Family history unknown: Yes    Social History Social History   Tobacco Use   Smoking status: Some Days   Smokeless tobacco: Never  Substance Use Topics   Alcohol use: Yes    Comment: occassional   Drug use: No     Allergies   Patient has no known allergies.   Review of Systems Review of Systems  As stated above in HPI Physical Exam Triage Vital Signs ED Triage Vitals  Enc Vitals Group     BP 10/29/20 1248 124/77     Pulse Rate 10/29/20 1248 69     Resp 10/29/20 1248 18     Temp 10/29/20 1248 99 F  (37.2 C)     Temp Source 10/29/20 1248 Oral     SpO2 10/29/20 1248 100 %     Weight --      Height --      Head Circumference --      Peak Flow --      Pain Score 10/29/20 1249 7     Pain Loc --      Pain Edu? --      Excl. in GC? --    No data found.  Updated Vital Signs BP 124/77 (BP Location: Right Arm)   Pulse 69   Temp 99 F (37.2 C) (Oral)   Resp 18   LMP 10/11/2020   SpO2 100%   Physical Exam Vitals and nursing note reviewed.  Constitutional:      General: She is not in acute distress.    Appearance: She is well-developed. She is not ill-appearing, toxic-appearing or diaphoretic.  HENT:     Head: Normocephalic and atraumatic.     Mouth/Throat:     Mouth: Mucous membranes are moist.     Pharynx: Oropharynx is clear.  Eyes:     General: No visual field deficit.    Extraocular Movements: Extraocular movements intact.     Right eye: Normal extraocular motion and no nystagmus.  Left eye: Normal extraocular motion and no nystagmus.     Pupils: Pupils are equal, round, and reactive to light.     Right eye: Pupil is round and reactive.     Left eye: Pupil is round and reactive.  Cardiovascular:     Rate and Rhythm: Normal rate and regular rhythm.     Heart sounds: Normal heart sounds.  Pulmonary:     Effort: Pulmonary effort is normal.     Breath sounds: Normal breath sounds.  Genitourinary:    Comments: Pt performed self swab collection Musculoskeletal:        General: Normal range of motion.     Cervical back: Normal range of motion and neck supple. No rigidity.  Lymphadenopathy:     Cervical: No cervical adenopathy.  Skin:    General: Skin is warm.  Neurological:     Mental Status: She is alert and oriented to person, place, and time.     Cranial Nerves: No cranial nerve deficit, dysarthria or facial asymmetry.     Sensory: No sensory deficit.     Motor: No weakness.     Coordination: Coordination normal.     Gait: Gait normal.     Deep Tendon  Reflexes: Reflexes normal.  Psychiatric:        Mood and Affect: Mood normal.        Speech: Speech normal.        Behavior: Behavior normal.     UC Treatments / Results  Labs (all labs ordered are listed, but only abnormal results are displayed) Labs Reviewed  POCT URINALYSIS DIPSTICK, ED / UC - Abnormal; Notable for the following components:      Result Value   Hgb urine dipstick TRACE (*)    All other components within normal limits  POC URINE PREG, ED    EKG   Radiology No results found.  Procedures Procedures (including critical care time)  Medications Ordered in UC Medications - No data to display  Initial Impression / Assessment and Plan / UC Course  I have reviewed the triage vital signs and the nursing notes.  Pertinent labs & imaging results that were available during my care of the patient were reviewed by me and considered in my medical decision making (see chart for details).     New.  Discussed with patient that her headache would likely benefit from Toradol and rest along with hydration with water.  Discussed this medication and that she should not use any other NSAIDs for 24 hours.  She can use Tylenol if needed.  Discussed red flag signs and symptoms.  I will send in Zofran to the pharmacy along with a refill of her muscle relaxers to use as needed as well.  In addition we discussed her vaginal symptoms.  STI screening pending.  She elected to wait until her results have returned until we treat her and not proceed forward with treatment for suspected BV. Final Clinical Impressions(s) / UC Diagnoses   Final diagnoses:  None   Discharge Instructions   None    ED Prescriptions   None    PDMP not reviewed this encounter.   Rushie Chestnut, [place] 10/29/20 1411

## 2020-10-31 ENCOUNTER — Telehealth (HOSPITAL_COMMUNITY): Payer: Self-pay | Admitting: Emergency Medicine

## 2020-10-31 LAB — CERVICOVAGINAL ANCILLARY ONLY
Bacterial Vaginitis (gardnerella): POSITIVE — AB
Candida Glabrata: POSITIVE — AB
Candida Vaginitis: POSITIVE — AB
Chlamydia: NEGATIVE
Comment: NEGATIVE
Comment: NEGATIVE
Comment: NEGATIVE
Comment: NEGATIVE
Comment: NEGATIVE
Comment: NORMAL
Neisseria Gonorrhea: NEGATIVE
Trichomonas: NEGATIVE

## 2020-10-31 MED ORDER — METRONIDAZOLE 500 MG PO TABS: 500.0000 mg | ORAL_TABLET | Freq: Two times a day (BID) | ORAL | 0 refills | Status: DC

## 2020-10-31 MED ORDER — FLUCONAZOLE 150 MG PO TABS: 150.0000 mg | ORAL_TABLET | Freq: Once | ORAL | 0 refills | Status: AC

## 2021-01-10 ENCOUNTER — Encounter: Payer: Self-pay | Admitting: Nurse Practitioner

## 2021-01-10 ENCOUNTER — Telehealth: Payer: PRIVATE HEALTH INSURANCE | Admitting: Nurse Practitioner

## 2021-01-10 DIAGNOSIS — N760 Acute vaginitis: Secondary | ICD-10-CM

## 2021-01-10 MED ORDER — FLUCONAZOLE 150 MG PO TABS: 150.0000 mg | ORAL_TABLET | Freq: Once | ORAL | 0 refills | Status: AC

## 2021-01-10 MED ORDER — METRONIDAZOLE 500 MG PO TABS: 500.0000 mg | ORAL_TABLET | Freq: Two times a day (BID) | ORAL | 0 refills | Status: DC

## 2021-01-10 NOTE — Progress Notes (Signed)
Virtual Visit Consent   [name], you are scheduled for a virtual visit with a [facility] provider today.     Just as with appointments in the office, your consent must be obtained to participate.  Your consent will be active for this visit and any virtual visit you may have with one of our providers in the next 365 days.     If you have a MyChart account, a copy of this consent can be sent to you electronically.  All virtual visits are billed to your insurance company just like a traditional visit in the office.    As this is a virtual visit, video technology does not allow for your provider to perform a traditional examination.  This may limit your provider's ability to fully assess your condition.  If your provider identifies any concerns that need to be evaluated in person or the need to arrange testing (such as labs, EKG, etc.), we will make arrangements to do so.     Although advances in technology are sophisticated, we cannot ensure that it will always work on either your end or our end.  If the connection with a video visit is poor, the visit may have to be switched to a telephone visit.  With either a video or telephone visit, we are not always able to ensure that we have a secure connection.     I need to obtain your verbal consent now.   Are you willing to proceed with your visit today?    [name] has provided verbal consent on 01/10/2021 for a virtual visit (video or telephone).   Claiborne Rigg, NP   Date: 01/10/2021 11:20 AM   Virtual Visit via Video Note   I, Claiborne Rigg, connected with  [name]  ([id number], [birth year]) on 01/10/21 at 11:15 AM EDT by a video-enabled telemedicine application and verified that I am speaking with the correct person using two identifiers.  Location: Patient: Virtual Visit Location Patient: Home Provider: Virtual Visit Location Provider: Home Office   I discussed the limitations of evaluation and management by  telemedicine and the availability of in person appointments. The patient expressed understanding and agreed to proceed.    History of Present Illness: [name] is a 40 y.o. who identifies as a female who was assigned female at birth, and is being seen today for vaginitis .  HPI:  Vaginitis: Patient complains of an abnormal vaginal discharge for several days. Vaginal symptoms include discharge described as milky and thick, local irritation, and odor.STI Risk: Very low risk of STD exposure. She denies any new partners and has been with her current partner for 8 years.Other associated symptoms: none.Menstrual pattern:    Problems:  Patient Active Problem List   Diagnosis Date Noted   DEPRESSION, MAJOR, RECURRENT 06/02/2006   TENSION HEADACHE 06/02/2006   RHINITIS, ALLERGIC 06/02/2006   ECZEMA, ATOPIC DERMATITIS 06/02/2006    Allergies: No Known Allergies Medications:  Current Outpatient Medications:    fluconazole (DIFLUCAN) 150 MG tablet, Take 1 tablet (150 mg total) by mouth once for 1 dose., Disp: 1 tablet, Rfl: 0   methocarbamol (ROBAXIN) 500 MG tablet, Take 1 tablet (500 mg total) by mouth 2 (two) times daily., Disp: 20 tablet, Rfl: 0   metroNIDAZOLE (FLAGYL) 500 MG tablet, Take 1 tablet (500 mg total) by mouth 2 (two) times daily., Disp: 14 tablet, Rfl: 0   ondansetron (ZOFRAN ODT) 4 MG disintegrating tablet, Take 1 tablet (4  mg total) by mouth every 8 (eight) hours as needed for nausea or vomiting., Disp: 20 tablet, Rfl: 0  Observations/Objective: Patient is well-developed, well-nourished in no acute distress.  Resting comfortably  at home.  Head is normocephalic, atraumatic.  No labored breathing.  Speech is clear and coherent with logical content.  Patient is alert and oriented at baseline.    Assessment and Plan: 1. Acute vaginitis - metroNIDAZOLE (FLAGYL) 500 MG tablet; Take 1 tablet (500 mg total) by mouth 2 (two) times daily.  Dispense: 14 tablet; Refill: 0 -  fluconazole (DIFLUCAN) 150 MG tablet; Take 1 tablet (150 mg total) by mouth once for 1 dose.  Dispense: 1 tablet; Refill: 0    Follow Up Instructions: I discussed the assessment and treatment plan with the patient. The patient was provided an opportunity to ask questions and all were answered. The patient agreed with the plan and demonstrated an understanding of the instructions.  A copy of instructions were sent to the patient via MyChart unless otherwise noted below.     The patient was advised to call back or seek an in-person evaluation if the symptoms worsen or if the condition fails to improve as anticipated.  Time:  I spent 10 minutes with the patient via telehealth technology discussing the above problems/concerns.    Claiborne Rigg, NP

## 2021-05-20 ENCOUNTER — Encounter: Payer: PRIVATE HEALTH INSURANCE | Admitting: Obstetrics

## 2021-06-30 ENCOUNTER — Encounter: Payer: PRIVATE HEALTH INSURANCE | Admitting: Obstetrics

## 2021-10-17 ENCOUNTER — Other Ambulatory Visit: Payer: Self-pay | Admitting: Nurse Practitioner

## 2021-10-17 ENCOUNTER — Other Ambulatory Visit (INDEPENDENT_AMBULATORY_CARE_PROVIDER_SITE_OTHER): Payer: Self-pay | Admitting: Nurse Practitioner

## 2021-10-17 DIAGNOSIS — N760 Acute vaginitis: Secondary | ICD-10-CM

## 2021-12-31 NOTE — Progress Notes (Signed)
Erroneous encounter-disregard

## 2022-01-07 ENCOUNTER — Encounter: Payer: PRIVATE HEALTH INSURANCE | Admitting: Family

## 2022-01-07 DIAGNOSIS — Z113 Encounter for screening for infections with a predominantly sexual mode of transmission: Secondary | ICD-10-CM

## 2022-01-07 DIAGNOSIS — Z7689 Persons encountering health services in other specified circumstances: Secondary | ICD-10-CM

## 2022-03-23 NOTE — Progress Notes (Signed)
Erroneous encounter-disregard

## 2022-03-31 ENCOUNTER — Encounter: Payer: PRIVATE HEALTH INSURANCE | Admitting: Family

## 2022-03-31 DIAGNOSIS — Z7689 Persons encountering health services in other specified circumstances: Secondary | ICD-10-CM

## 2022-09-17 ENCOUNTER — Ambulatory Visit
Admission: EM | Admit: 2022-09-17 | Discharge: 2022-09-17 | Disposition: A | Discharge status: Home / Self Care | Payer: Medicaid Other | Attending: Internal Medicine | Admitting: Internal Medicine

## 2022-09-17 DIAGNOSIS — S39012A Strain of muscle, fascia and tendon of lower back, initial encounter: Secondary | ICD-10-CM

## 2022-09-17 DIAGNOSIS — M545 Low back pain, unspecified: Secondary | ICD-10-CM | POA: Diagnosis not present

## 2022-09-17 MED ORDER — METHOCARBAMOL 500 MG PO TABS: 500.0000 mg | ORAL_TABLET | Freq: Two times a day (BID) | ORAL | 0 refills | Status: AC | PRN

## 2022-09-17 MED ORDER — KETOROLAC TROMETHAMINE 30 MG/ML IJ SOLN
30.0000 mg | Freq: Once | INTRAMUSCULAR | Status: AC
  Administered 2022-09-17: 30 mg via INTRAMUSCULAR

## 2022-09-17 NOTE — ED Triage Notes (Signed)
Pt reports low back pain after she was bending over putting grocery in the fridge today. States she cannot move.

## 2022-09-17 NOTE — ED Provider Notes (Signed)
[facility]    CSN: [id number] Arrival date & time: 09/17/22  1551      History   Chief Complaint Chief Complaint  Patient presents with   Back Pain    HPI [name] is a 42 y.o. female.   Patient presents with significant lower back pain that started around 3 PM today.  Patient reports that it started after she was putting a case of soda in the refrigerator and was bent over.  When she stood back up, she had significant lower back pain.  She has not taken any medication for pain.  Denies urinary frequency, urinary or bowel incontinence, saddle anesthesia.   Back Pain   History reviewed. No pertinent past medical history.  Patient Active Problem List   Diagnosis Date Noted   DEPRESSION, MAJOR, RECURRENT 06/02/2006   TENSION HEADACHE 06/02/2006   RHINITIS, ALLERGIC 06/02/2006   ECZEMA, ATOPIC DERMATITIS 06/02/2006    Past Surgical History:  Procedure Laterality Date   CESAREAN SECTION      OB History   No obstetric history on file.      Home Medications    Prior to Admission medications   Medication Sig Start Date End Date Taking? Authorizing Provider  methocarbamol (ROBAXIN) 500 MG tablet Take 1 tablet (500 mg total) by mouth 2 (two) times daily as needed for muscle spasms. 09/17/22  Yes Quadasia Newsham, Acie Fredrickson, FNP  metroNIDAZOLE (FLAGYL) 500 MG tablet Take 1 tablet (500 mg total) by mouth 2 (two) times daily. 01/10/21   Claiborne Rigg, NP  ondansetron (ZOFRAN ODT) 4 MG disintegrating tablet Take 1 tablet (4 mg total) by mouth every 8 (eight) hours as needed for nausea or vomiting. 10/29/20   Rushie Chestnut, PA-C    Family History Family History  Family history unknown: Yes    Social History Social History   Tobacco Use   Smoking status: Some Days   Smokeless tobacco: Never  Substance Use Topics   Alcohol use: Yes    Comment: occassional   Drug use: No     Allergies   Patient has no known allergies.   Review of Systems Review  of Systems Per HPI  Physical Exam Triage Vital Signs ED Triage Vitals  Enc Vitals Group     BP 09/17/22 1558 131/84     Pulse Rate 09/17/22 1558 70     Resp 09/17/22 1558 18     Temp 09/17/22 1558 98.2 F (36.8 C)     Temp Source 09/17/22 1558 Oral     SpO2 09/17/22 1558 96 %     Weight --      Height --      Head Circumference --      Peak Flow --      Pain Score 09/17/22 1601 10     Pain Loc --      Pain Edu? --      Excl. in GC? --    No data found.  Updated Vital Signs BP 131/84 (BP Location: Left Arm)   Pulse 70   Temp 98.2 F (36.8 C) (Oral)   Resp 18   LMP 09/08/2022 (Approximate)   SpO2 96%   Visual Acuity Right Eye Distance:   Left Eye Distance:   Bilateral Distance:    Right Eye Near:   Left Eye Near:    Bilateral Near:     Physical Exam Constitutional:      General: She is not in acute distress.  Appearance: Normal appearance. She is not toxic-appearing or diaphoretic.  HENT:     Head: Normocephalic and atraumatic.  Eyes:     Extraocular Movements: Extraocular movements intact.     Conjunctiva/sclera: Conjunctivae normal.  Pulmonary:     Effort: Pulmonary effort is normal.  Musculoskeletal:       Back:     Comments: Patient has tenderness to palpation to bilateral lower lumbar region.  No obvious direct spinal tenderness, crepitus, step-off noted.  No swelling or discoloration present.  Neurological:     General: No focal deficit present.     Mental Status: She is alert and oriented to person, place, and time. Mental status is at baseline.  Psychiatric:        Mood and Affect: Mood normal.        Behavior: Behavior normal.        Thought Content: Thought content normal.        Judgment: Judgment normal.      UC Treatments / Results  Labs (all labs ordered are listed, but only abnormal results are displayed) Labs Reviewed - No data to display  EKG   Radiology No results found.  Procedures Procedures (including critical care  time)  Medications Ordered in UC Medications  ketorolac (TORADOL) 30 MG/ML injection 30 mg (30 mg Intramuscular Given 09/17/22 1628)    Initial Impression / Assessment and Plan / UC Course  I have reviewed the triage vital signs and the nursing notes.  Pertinent labs & imaging results that were available during my care of the patient were reviewed by me and considered in my medical decision making (see chart for details).     Suspect lumbar strain.  Given no direct spinal tenderness or trauma to the back, will defer imaging.  Patient prescribed muscle relaxer to take as needed.  Advised patient muscle relaxer can make her drowsy and do not drive or drink alcohol with taking it.  IM Toradol administered in urgent care today as well.  Advised no NSAIDs for at least 24 hours.  Advised safe over-the-counter pain relievers, supportive care, alternating ice and heat to affected area.  Advised following up with the ER if any symptoms persist or worsen for CT imaging.  Patient verbalized understanding and was agreeable with plan. Final Clinical Impressions(s) / UC Diagnoses   Final diagnoses:  Strain of lumbar region, initial encounter  Acute bilateral low back pain without sciatica     Discharge Instructions      It appears that you have strained your back.  I have prescribed you muscle relaxer to take as needed.  Please be advised that it can make you drowsy so no driving or drinking alcohol with taking it.  You are also given a shot for pain today in urgent care.  Do not take any ibuprofen, Advil, Aleve for at least 24 hours following injection.  Go to the ER for imaging if any symptoms persist or worsen.    ED Prescriptions     Medication Sig Dispense Auth. Provider   methocarbamol (ROBAXIN) 500 MG tablet Take 1 tablet (500 mg total) by mouth 2 (two) times daily as needed for muscle spasms. 20 tablet [place], Acie Fredrickson, [place]      PDMP not reviewed this encounter.   Gustavus Bryant,  [place] 09/17/22 [phone]

## 2022-09-17 NOTE — Discharge Instructions (Signed)
It appears that you have strained your back.  I have prescribed you muscle relaxer to take as needed.  Please be advised that it can make you drowsy so no driving or drinking alcohol with taking it.  You are also given a shot for pain today in urgent care.  Do not take any ibuprofen, Advil, Aleve for at least 24 hours following injection.  Go to the ER for imaging if any symptoms persist or worsen.

## 2022-10-21 ENCOUNTER — Ambulatory Visit
Admission: EM | Admit: 2022-10-21 | Discharge: 2022-10-21 | Disposition: A | Discharge status: Home / Self Care | Payer: Medicaid Other | Attending: Internal Medicine | Admitting: Internal Medicine

## 2022-10-21 ENCOUNTER — Encounter: Payer: Self-pay | Admitting: *Deleted

## 2022-10-21 DIAGNOSIS — N898 Other specified noninflammatory disorders of vagina: Secondary | ICD-10-CM | POA: Insufficient documentation

## 2022-10-21 DIAGNOSIS — Z113 Encounter for screening for infections with a predominantly sexual mode of transmission: Secondary | ICD-10-CM

## 2022-10-21 NOTE — ED Triage Notes (Signed)
C/O starting with vaginal pruritus and irritation onset 4 days ago along with vaginal discharge. States treated herself with OTC yeast med, but has had no relief. Denies abd pain or fevers.

## 2022-10-21 NOTE — Discharge Instructions (Addendum)
Your vaginal swab is pending.  We will call when it results and send appropriate treatment.  Follow-up if any symptoms persist or worsen.

## 2022-10-21 NOTE — ED Provider Notes (Signed)
[facility]    CSN: [id number] Arrival date & time: 10/21/22  1050      History   Chief Complaint Chief Complaint  Patient presents with   Vaginal Discharge    HPI [name] is a 42 y.o. female.   Patient presents with white vaginal discharge, vaginal itching, vaginal irritation that started about 4 days ago.  Denies dysuria, urinary frequency, abdominal pain, back pain, fever, chills.  Reports that she took an over-the-counter yeast medication with no improvement in symptoms.  Denies exposure to STD but has had unprotected sexual intercourse prior to symptoms starting.  Last menstrual cycle was 10/08/2022.   Vaginal Discharge   History reviewed. No pertinent past medical history.  Patient Active Problem List   Diagnosis Date Noted   DEPRESSION, MAJOR, RECURRENT 06/02/2006   TENSION HEADACHE 06/02/2006   RHINITIS, ALLERGIC 06/02/2006   ECZEMA, ATOPIC DERMATITIS 06/02/2006    Past Surgical History:  Procedure Laterality Date   CESAREAN SECTION      OB History   No obstetric history on file.      Home Medications    Prior to Admission medications   Medication Sig Start Date End Date Taking? Authorizing Provider  methocarbamol (ROBAXIN) 500 MG tablet Take 1 tablet (500 mg total) by mouth 2 (two) times daily as needed for muscle spasms. 09/17/22   Gustavus Bryant, FNP  metroNIDAZOLE (FLAGYL) 500 MG tablet Take 1 tablet (500 mg total) by mouth 2 (two) times daily. 01/10/21   Claiborne Rigg, NP  ondansetron (ZOFRAN ODT) 4 MG disintegrating tablet Take 1 tablet (4 mg total) by mouth every 8 (eight) hours as needed for nausea or vomiting. 10/29/20   Rushie Chestnut, PA-C    Family History Family History  Family history unknown: Yes    Social History Social History   Tobacco Use   Smoking status: Some Days   Smokeless tobacco: Never  Vaping Use   Vaping status: Never Used  Substance Use Topics   Alcohol use: Yes    Comment: occasionally    Drug use: No     Allergies   Patient has no known allergies.   Review of Systems Review of Systems Per HPI  Physical Exam Triage Vital Signs ED Triage Vitals  Encounter Vitals Group     BP 10/21/22 1102 (!) 147/68     Systolic BP Percentile --      Diastolic BP Percentile --      Pulse Rate 10/21/22 1102 78     Resp 10/21/22 1102 16     Temp 10/21/22 1102 98.1 F (36.7 C)     Temp Source 10/21/22 1102 Oral     SpO2 10/21/22 1102 98 %     Weight --      Height --      Head Circumference --      Peak Flow --      Pain Score 10/21/22 1103 0     Pain Loc --      Pain Education --      Exclude from Growth Chart --    No data found.  Updated Vital Signs BP (!) 147/68   Pulse 78   Temp 98.1 F (36.7 C) (Oral)   Resp 16   LMP 10/08/2022 (Approximate)   SpO2 98%   Visual Acuity Right Eye Distance:   Left Eye Distance:   Bilateral Distance:    Right Eye Near:   Left Eye Near:  Bilateral Near:     Physical Exam Constitutional:      General: She is not in acute distress.    Appearance: Normal appearance. She is not toxic-appearing or diaphoretic.  HENT:     Head: Normocephalic and atraumatic.  Eyes:     Extraocular Movements: Extraocular movements intact.     Conjunctiva/sclera: Conjunctivae normal.  Pulmonary:     Effort: Pulmonary effort is normal.  Genitourinary:    Comments: Deferred with shared decision making.  Self swab performed. Neurological:     General: No focal deficit present.     Mental Status: She is alert and oriented to person, place, and time. Mental status is at baseline.  Psychiatric:        Mood and Affect: Mood normal.        Behavior: Behavior normal.        Thought Content: Thought content normal.        Judgment: Judgment normal.      UC Treatments / Results  Labs (all labs ordered are listed, but only abnormal results are displayed) Labs Reviewed  CERVICOVAGINAL ANCILLARY ONLY    EKG   Radiology No results  found.  Procedures Procedures (including critical care time)  Medications Ordered in UC Medications - No data to display  Initial Impression / Assessment and Plan / UC Course  I have reviewed the triage vital signs and the nursing notes.  Pertinent labs & imaging results that were available during my care of the patient were reviewed by me and considered in my medical decision making (see chart for details).     Cervicovaginal swab pending.  Given no confirmed exposure to STD, will await results for further treatment.  Patient advised to refrain from sexual activity until test results and treatment are complete.  Advise strict follow-up precautions.  Patient verbalized understanding and was agreeable with plan. Final Clinical Impressions(s) / UC Diagnoses   Final diagnoses:  Vaginal discharge  Screening examination for venereal disease     Discharge Instructions      Your vaginal swab is pending.  We will call when it results and send appropriate treatment.  Follow-up if any symptoms persist or worsen.    ED Prescriptions   None    PDMP not reviewed this encounter.   Gustavus Bryant, [place] 10/21/22 1128

## 2022-10-22 ENCOUNTER — Telehealth (HOSPITAL_COMMUNITY): Payer: Self-pay | Admitting: Emergency Medicine

## 2022-10-22 LAB — CERVICOVAGINAL ANCILLARY ONLY
Bacterial Vaginitis (gardnerella): POSITIVE — AB
Candida Glabrata: POSITIVE — AB
Candida Vaginitis: POSITIVE — AB
Chlamydia: NEGATIVE
Comment: NEGATIVE
Comment: NEGATIVE
Comment: NEGATIVE
Comment: NEGATIVE
Comment: NEGATIVE
Comment: NORMAL
Neisseria Gonorrhea: NEGATIVE
Trichomonas: POSITIVE — AB

## 2022-10-22 MED ORDER — FLUCONAZOLE 150 MG PO TABS: 150.0000 mg | ORAL_TABLET | Freq: Once | ORAL | 0 refills | Status: AC

## 2022-10-22 MED ORDER — METRONIDAZOLE 500 MG PO TABS: 500.0000 mg | ORAL_TABLET | Freq: Two times a day (BID) | ORAL | 0 refills | Status: DC

## 2022-11-11 ENCOUNTER — Encounter: Payer: PRIVATE HEALTH INSURANCE | Admitting: Family

## 2022-11-11 NOTE — Progress Notes (Signed)
Erroneous encounter-disregard

## 2023-02-22 ENCOUNTER — Other Ambulatory Visit: Payer: Self-pay

## 2023-02-22 ENCOUNTER — Ambulatory Visit
Admission: RE | Admit: 2023-02-22 | Discharge: 2023-02-22 | Disposition: A | Discharge status: Home / Self Care | Payer: Medicaid Other | Source: Ambulatory Visit

## 2023-02-22 DIAGNOSIS — N898 Other specified noninflammatory disorders of vagina: Secondary | ICD-10-CM | POA: Insufficient documentation

## 2023-02-22 DIAGNOSIS — L309 Dermatitis, unspecified: Secondary | ICD-10-CM | POA: Diagnosis present

## 2023-02-22 HISTORY — DX: Dermatitis, unspecified: L30.9

## 2023-02-22 MED ORDER — TRIAMCINOLONE ACETONIDE 0.1 % EX CREA: 1.0000 | TOPICAL_CREAM | Freq: Two times a day (BID) | CUTANEOUS | 0 refills | Status: AC

## 2023-02-22 MED ORDER — HYDROCORTISONE 2.5 % EX CREA: TOPICAL_CREAM | Freq: Every day | CUTANEOUS | 0 refills | Status: AC | PRN

## 2023-02-22 NOTE — ED Triage Notes (Signed)
Vaginal discharge x 2 days with discomfort. Also requests cream for eczema flare-up

## 2023-02-23 ENCOUNTER — Telehealth (HOSPITAL_COMMUNITY): Payer: Self-pay | Admitting: Emergency Medicine

## 2023-02-23 LAB — CERVICOVAGINAL ANCILLARY ONLY
Bacterial Vaginitis (gardnerella): POSITIVE — AB
Candida Glabrata: NEGATIVE
Candida Vaginitis: POSITIVE — AB
Chlamydia: NEGATIVE
Comment: NEGATIVE
Comment: NEGATIVE
Comment: NEGATIVE
Comment: NEGATIVE
Comment: NEGATIVE
Comment: NORMAL
Neisseria Gonorrhea: NEGATIVE
Trichomonas: NEGATIVE

## 2023-02-23 MED ORDER — FLUCONAZOLE 150 MG PO TABS: 150.0000 mg | ORAL_TABLET | Freq: Once | ORAL | 0 refills | Status: AC

## 2023-02-23 MED ORDER — METRONIDAZOLE 500 MG PO TABS: 500.0000 mg | ORAL_TABLET | Freq: Two times a day (BID) | ORAL | 0 refills | Status: AC

## 2023-02-23 NOTE — Telephone Encounter (Signed)
Metronidazole for positive BV Diflucan for positive yeast Per protocol

## 2023-03-05 NOTE — ED Provider Notes (Signed)
[facility]    CSN: [id number] Arrival date & time: 02/22/23  1826      History   Chief Complaint Chief Complaint  Patient presents with   Vaginal Discharge    HPI [name] is a 42 y.o. female.   Patient here today for evaluation of vaginal discharge she has had last 2 days.  She denies any genital lesions.  She has not had any known STD exposures.  She also request medication for eczema flare.  The history is provided by the patient.  Vaginal Discharge Associated symptoms: no abdominal pain, no dysuria, no fever, no nausea and no vomiting     Past Medical History:  Diagnosis Date   Eczema     Patient Active Problem List   Diagnosis Date Noted   Major depressive disorder, recurrent episode (HCC) 06/02/2006   TENSION HEADACHE 06/02/2006   Allergic rhinitis 06/02/2006   ECZEMA, ATOPIC DERMATITIS 06/02/2006    Past Surgical History:  Procedure Laterality Date   CESAREAN SECTION      OB History   No obstetric history on file.      Home Medications    Prior to Admission medications   Medication Sig Start Date End Date Taking? Authorizing Provider  hydrocortisone 2.5%-Eucerin equivalent 1:1 cream mixture Apply topically daily as needed for rash or itching. 02/22/23  Yes Tomi Bamberger, PA-C  triamcinolone cream (KENALOG) 0.1 % Apply 1 Application topically 2 (two) times daily. 02/22/23  Yes Tomi Bamberger, PA-C  HYDROcodone-acetaminophen (NORCO/VICODIN) 5-325 MG tablet Take 1 tablet by mouth 4 (four) times daily. 10/13/22   [provider]  ibuprofen (ADVIL) 800 MG tablet Take 800 mg by mouth 3 (three) times daily. 10/09/22   [provider]  methocarbamol (ROBAXIN) 500 MG tablet Take 1 tablet (500 mg total) by mouth 2 (two) times daily as needed for muscle spasms. 09/17/22   Gustavus Bryant, FNP  metroNIDAZOLE (FLAGYL) 500 MG tablet Take 1 tablet (500 mg total) by mouth 2 (two) times daily. 02/23/23   Merrilee Jansky, MD   ondansetron (ZOFRAN ODT) 4 MG disintegrating tablet Take 1 tablet (4 mg total) by mouth every 8 (eight) hours as needed for nausea or vomiting. 10/29/20   Rushie Chestnut, PA-C    Family History Family History  Family history unknown: Yes    Social History Social History   Tobacco Use   Smoking status: Every Day    Types: Cigarettes   Smokeless tobacco: Never  Vaping Use   Vaping status: Never Used  Substance Use Topics   Alcohol use: Not Currently    Comment: occasionally   Drug use: No     Allergies   Patient has no known allergies.   Review of Systems Review of Systems  Constitutional:  Negative for chills and fever.  Eyes:  Negative for discharge and redness.  Respiratory:  Negative for shortness of breath.   Gastrointestinal:  Negative for abdominal pain, nausea and vomiting.  Genitourinary:  Positive for vaginal discharge. Negative for dysuria and genital sores.     Physical Exam Triage Vital Signs ED Triage Vitals  Encounter Vitals Group     BP --      Systolic BP Percentile --      Diastolic BP Percentile --      Pulse Rate 02/22/23 1911 (P) 79     Resp 02/22/23 1911 (P) 18     Temp 02/22/23 1911 (P) 98.9 F (37.2 C)  Temp Source 02/22/23 1911 (P) Oral     SpO2 02/22/23 1911 (P) 97 %     Weight --      Height --      Head Circumference --      Peak Flow --      Pain Score 02/22/23 1908 8     Pain Loc --      Pain Education --      Exclude from Growth Chart --    No data found.  Updated Vital Signs Pulse (P) 79   Temp (P) 98.9 F (37.2 C) (Oral)   Resp (P) 18   LMP 01/28/2023   SpO2 (P) 97%   Visual Acuity Right Eye Distance:   Left Eye Distance:   Bilateral Distance:    Right Eye Near:   Left Eye Near:    Bilateral Near:     Physical Exam Vitals and nursing note reviewed.  Constitutional:      General: She is not in acute distress.    Appearance: Normal appearance. She is not ill-appearing.  HENT:     Head:  Normocephalic and atraumatic.  Eyes:     Conjunctiva/sclera: Conjunctivae normal.  Cardiovascular:     Rate and Rhythm: Normal rate.  Pulmonary:     Effort: Pulmonary effort is normal. No respiratory distress.  Neurological:     Mental Status: She is alert.  Psychiatric:        Mood and Affect: Mood normal.        Behavior: Behavior normal.        Thought Content: Thought content normal.      UC Treatments / Results  Labs (all labs ordered are listed, but only abnormal results are displayed) Labs Reviewed  CERVICOVAGINAL ANCILLARY ONLY - Abnormal; Notable for the following components:      Result Value   Bacterial Vaginitis (gardnerella) Positive (*)    Candida Vaginitis Positive (*)    All other components within normal limits    EKG   Radiology No results found.  Procedures Procedures (including critical care time)  Medications Ordered in UC Medications - No data to display  Initial Impression / Assessment and Plan / UC Course  I have reviewed the triage vital signs and the nursing notes.  Pertinent labs & imaging results that were available during my care of the patient were reviewed by me and considered in my medical decision making (see chart for details).    Screening ordered to rule out BV or yeast as well as gonorrhea, chlamydia, trichomonas as cause of vaginal discharge.  Will await results further recommendation.  Steroid cream prescribed for eczema as well as a mixture of hydrocortisone and Eucerin as this has worked well for her in the past.  Encouraged follow-up if no improvement.  Final Clinical Impressions(s) / UC Diagnoses   Final diagnoses:  Vaginal discharge  Eczema, unspecified type   Discharge Instructions   None    ED Prescriptions     Medication Sig Dispense Auth. Provider   triamcinolone cream (KENALOG) 0.1 % Apply 1 Application topically 2 (two) times daily. 30 g Tomi Bamberger, PA-C   hydrocortisone 2.5%-Eucerin equivalent 1:1  cream mixture Apply topically daily as needed for rash or itching. 120 g Tomi Bamberger, PA-C      PDMP not reviewed this encounter.   Tomi Bamberger, PA-C 03/05/23 [phone]

## 2023-07-28 ENCOUNTER — Encounter: Payer: Self-pay | Admitting: Emergency Medicine

## 2023-07-28 ENCOUNTER — Ambulatory Visit: Admission: EM | Admit: 2023-07-28 | Discharge: 2023-07-28 | Disposition: A | Discharge status: Home / Self Care

## 2023-07-28 DIAGNOSIS — Z113 Encounter for screening for infections with a predominantly sexual mode of transmission: Secondary | ICD-10-CM | POA: Diagnosis not present

## 2023-07-28 DIAGNOSIS — N898 Other specified noninflammatory disorders of vagina: Secondary | ICD-10-CM | POA: Diagnosis not present

## 2023-07-28 DIAGNOSIS — K047 Periapical abscess without sinus: Secondary | ICD-10-CM | POA: Diagnosis present

## 2023-07-28 MED ORDER — IBUPROFEN 800 MG PO TABS: 800.0000 mg | ORAL_TABLET | Freq: Three times a day (TID) | ORAL | 0 refills | Status: AC | PRN

## 2023-07-28 MED ORDER — FLUCONAZOLE 150 MG PO TABS: 150.0000 mg | ORAL_TABLET | ORAL | 0 refills | Status: AC | PRN

## 2023-07-28 MED ORDER — CLINDAMYCIN HCL 300 MG PO CAPS: 300.0000 mg | ORAL_CAPSULE | Freq: Three times a day (TID) | ORAL | 0 refills | Status: AC

## 2023-07-28 NOTE — ED Triage Notes (Signed)
 Pt reports dental pain x2 days on R lower gumline. Area of swelling after last molar noted. Pt reports her dentist removed all her bad teeth 3-20months ago. She has contacted them and cannot get in until 08/12/23.   Pt also reports vaginal irritation and itching x1 week. Constant burning sensation with sticky white-yellow discharge. Pt notes that there is possibility of STD exposure and would like to complete cytology swab and blood work.

## 2023-07-28 NOTE — ED Provider Notes (Signed)
 [facility]    CSN: [id number] Arrival date & time: 07/28/23  1658      History   Chief Complaint Chief Complaint  Patient presents with   Dental Pain   Vaginal Discharge    HPI [name] is a 43 y.o. female.  Patient here today for evaluation of dental pain x 2 days on right lower gumline.  She endorses swelling has been recurrent gingival swelling and tenderness around her distal molar tooth.  Reports 3 to 6 months ago having several teeth extracted.  She has called her dentist but they cannot get her in until 2 weeks from today.  Patient is here with vaginal irritation and itching x 1 week.  Endorses discolored vaginal discharge.  Possible STD and would like blood work.   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      Home Medications    Prior to Admission medications   Medication Sig Start Date End Date Taking? Authorizing Provider  ibuprofen  (ADVIL ) 800 MG tablet Take 800 mg by mouth 3 (three) times daily. 10/09/22  Yes [provider]  fluconazole  (DIFLUCAN ) 150 MG tablet Take by mouth. Patient not taking: Reported on 07/28/2023 02/23/23   [provider]  HYDROcodone -acetaminophen  (NORCO/VICODIN) 5-325 MG tablet Take 1 tablet by mouth 4 (four) times daily. Patient not taking: Reported on 07/28/2023 10/13/22   [provider]  hydrocortisone  2.5%-Eucerin equivalent 1:1 cream mixture Apply topically daily as needed for rash or itching. Patient not taking: Reported on 07/28/2023 02/22/23   Vernestine Gondola, PA-C  methocarbamol  (ROBAXIN ) 500 MG tablet Take 1 tablet (500 mg  total) by mouth 2 (two) times daily as needed for muscle spasms. Patient not taking: Reported on 07/28/2023 09/17/22   Dodson Freestone, FNP  metroNIDAZOLE  (FLAGYL ) 500 MG tablet Take 1 tablet (500 mg total) by mouth 2 (two) times daily. Patient not taking: Reported on 07/28/2023 02/23/23   Corine Dice, MD  ondansetron  (ZOFRAN  ODT) 4 MG disintegrating tablet Take 1 tablet (4 mg total) by mouth every 8 (eight) hours as needed for nausea or vomiting. Patient not taking: Reported on 07/28/2023 10/29/20   Sharla Davis, PA-C  triamcinolone  cream (KENALOG ) 0.1 % Apply 1 Application topically 2 (two) times daily. Patient not taking: Reported on 07/28/2023 02/22/23   Vernestine Gondola, PA-C    Family History Family History  Family history unknown: Yes    Social History Social History   Tobacco Use   Smoking status: Every Day    Types: Cigarettes   Smokeless tobacco: Never  Vaping Use   Vaping status: Never Used  Substance Use Topics   Alcohol use: Not Currently    Comment: occasionally   Drug use: No     Allergies   Patient has no known allergies.   Review of Systems Review of Systems  Genitourinary:  Positive for vaginal discharge.     Physical Exam Triage Vital Signs ED Triage Vitals [07/28/23 1840]  Encounter Vitals Group     BP 112/75     Systolic BP Percentile  Diastolic BP Percentile      Pulse Rate 96     Resp 16     Temp 98.1 F (36.7 C)     Temp Source Oral     SpO2 98 %     Weight      Height      Head Circumference      Peak Flow      Pain Score 10     Pain Loc      Pain Education      Exclude from Growth Chart    No data found.  Updated Vital Signs BP 112/75 (BP Location: Left Arm)   Pulse 96   Temp 98.1 F (36.7 C) (Oral)   Resp 16   LMP 06/17/2023 (Exact Date)   SpO2 98%   Visual Acuity Right Eye Distance:   Left Eye Distance:   Bilateral Distance:    Right Eye Near:   Left Eye Near:    Bilateral Near:     Physical  Exam   UC Treatments / Results  Labs (all labs ordered are listed, but only abnormal results are displayed) Labs Reviewed - No data to display  EKG   Radiology No results found.  Procedures Procedures (including critical care time)  Medications Ordered in UC Medications - No data to display  Initial Impression / Assessment and Plan / UC Course  I have reviewed the triage vital signs and the nursing notes.  Pertinent labs & imaging results that were available during my care of the patient were reviewed by me and considered in my medical decision making (see chart for details).     *** Final Clinical Impressions(s) / UC Diagnoses   Final diagnoses:  None   Discharge Instructions   None    ED Prescriptions   None    PDMP not reviewed this encounter.

## 2023-07-28 NOTE — Discharge Instructions (Addendum)
 Lab results take up to 2-4 days, however can result sooner.   Our office will only call if results are abnormal and require treatment. If you see a result and have questions feel free to reach out via phone and call this office directly and request to speak with the nurse.  All results will be available to view on MyChart.

## 2023-07-29 LAB — CERVICOVAGINAL ANCILLARY ONLY
Bacterial Vaginitis (gardnerella): POSITIVE — AB
Candida Glabrata: POSITIVE — AB
Candida Vaginitis: POSITIVE — AB
Chlamydia: NEGATIVE
Comment: NEGATIVE
Comment: NEGATIVE
Comment: NEGATIVE
Comment: NEGATIVE
Comment: NEGATIVE
Comment: NORMAL
Neisseria Gonorrhea: NEGATIVE
Trichomonas: NEGATIVE

## 2023-07-30 LAB — HIV ANTIBODY (ROUTINE TESTING W REFLEX): HIV Screen 4th Generation wRfx: NONREACTIVE

## 2023-07-30 LAB — RPR: RPR Ser Ql: NONREACTIVE
